# Patient Record
Sex: Male | Born: 1950 | Race: White | Hispanic: No
Health system: Midwestern US, Academic
[De-identification: ages and names within clinical notes are randomized; demographics above are authoritative.]

---

## 2016-04-28 MED ORDER — FLECAINIDE 100 MG PO TAB
100 mg | ORAL_TABLET | Freq: Three times a day (TID) | ORAL | 3 refills | 30.00000 days | Status: DC
Start: 2016-04-28 — End: 2017-01-02

## 2016-08-27 ENCOUNTER — Encounter: Admit: 2016-08-27 | Discharge: 2016-08-27 | Payer: MEDICARE

## 2016-08-29 MED ORDER — LOSARTAN 100 MG PO TAB
ORAL_TABLET | Freq: Every day | ORAL | 11 refills | 30.00000 days | Status: AC
Start: 2016-08-29 — End: 2017-08-28

## 2016-09-03 ENCOUNTER — Encounter: Admit: 2016-09-03 | Discharge: 2016-09-03 | Payer: MEDICARE

## 2016-09-05 ENCOUNTER — Encounter: Admit: 2016-09-05 | Discharge: 2016-09-05 | Payer: MEDICARE

## 2016-09-05 MED ORDER — DIGOXIN 250 MCG PO TAB
ORAL_TABLET | Freq: Every day | ORAL | 11 refills | 30.00000 days | Status: AC
Start: 2016-09-05 — End: 2016-09-05

## 2016-09-05 MED ORDER — DIGOXIN 250 MCG PO TAB
125 ug | ORAL_TABLET | Freq: Every day | ORAL | 11 refills | 30.00000 days | Status: AC
Start: 2016-09-05 — End: 2017-12-07

## 2016-09-07 ENCOUNTER — Encounter: Admit: 2016-09-07 | Discharge: 2016-09-07 | Payer: MEDICARE

## 2016-09-07 MED ORDER — VERAPAMIL 120 MG PO TBER
ORAL_TABLET | Freq: Two times a day (BID) | ORAL | 11 refills | 90.00000 days | Status: AC
Start: 2016-09-07 — End: 2017-08-28

## 2016-09-28 ENCOUNTER — Encounter: Admit: 2016-09-28 | Discharge: 2016-09-28 | Payer: BC Managed Care – HMO

## 2016-09-28 ENCOUNTER — Encounter: Admit: 2016-09-28 | Discharge: 2016-09-28 | Payer: MEDICARE

## 2016-09-28 DIAGNOSIS — R972 Elevated prostate specific antigen [PSA]: Principal | ICD-10-CM

## 2016-09-28 DIAGNOSIS — I4891 Unspecified atrial fibrillation: Principal | ICD-10-CM

## 2016-09-28 DIAGNOSIS — N400 Enlarged prostate without lower urinary tract symptoms: ICD-10-CM

## 2016-09-28 DIAGNOSIS — Z720 Tobacco use: ICD-10-CM

## 2016-09-28 DIAGNOSIS — M1990 Unspecified osteoarthritis, unspecified site: ICD-10-CM

## 2016-09-28 DIAGNOSIS — M549 Dorsalgia, unspecified: ICD-10-CM

## 2016-09-28 DIAGNOSIS — I10 Essential (primary) hypertension: ICD-10-CM

## 2016-09-28 MED ORDER — CIPROFLOXACIN HCL 500 MG PO TAB
500 mg | ORAL_TABLET | Freq: Two times a day (BID) | ORAL | 0 refills | 10.00000 days | Status: AC
Start: 2016-09-28 — End: 2017-01-26

## 2016-09-28 NOTE — Telephone Encounter
Navigation Intake Assessment Document    Patient Name:  Zachary Manning  DOB:  09-Apr-1950  Insurance:   Zachary Manning  [id number]    Appointment Info:  [facility]: Dr. Alveta Heimlich, MD  09/28/16 arrive at 0930/ appointment at 10:00am    Diagnosis & Reason for Visit:  Elevated PSA    Physician Info:   Referring Physician:  Loura Back, MD   Contact Name & Number:  [facility]   PCP:    Location of Films:  No imaging    Location of Pathology:  no pathology    History of Present Illness:  Zachary Manning is a 66 y.o. male, referred by Zachary Back, MD, for prostate cancer screening.  His most recent PSA was 5.88 ng/mL on 08/23/16.  He has had prior PSA screening.     Prior history of elevated PSA: Yes .  Prior prostate biopsy: no.  History of enlarged prostate/BPH: Yes .  History of recent trauma or catheter placement (last 3 months): No.  UTI/ Pyelonephritis: No.  Prostatitis: No.  Family history of prostate cancer: none.  Family history of BRCA breast cancer or Lynch syndrome: No.    Lower urinary tract symptoms:     Hematuria: No.     Dysuria: No.     Frequency: Yes .     Urgency: Yes . Occasional     Nocturia: Yes .x1/night     Incomplete voids: No.    His past medical history is significant for BPH, hypertension, atrial fibrillation, Manning/spine pain, tobacco use (smokeless tobacco x 40 years)    PSA history (up to last 5 years):   11/26/15  PSA  5.7 ng/mL  Free PSA:  0.95   %Free PSA:  16.7% free (per note)  08/23/16  PSA  5.88 ng/mL    Comments:  Patient verbalized understanding of appointment date, time, and location.  Has my name and number if questions.  Scheduling letter to home address by [employer].

## 2016-09-28 NOTE — Progress Notes
Date of Service: 09/28/2016     Subjective:             History of Present Illness  Zachary Manning is a 66 y.o. male with  BPH, hypertension, atrial fibrillation (digoxin/flecainide, no blood thinners), back/spine pain, tobacco use (smokeless tobacco x 40 years who presents for referred by Benito Mccreedy, MD, for elevated PSA most recently to 5.88 ng/mL on 08/23/16. He has had prior PSA screening only once before 11/2015 which was stable at 5.7 ng/mL and his free PSA was 16.7% at that time. Patient has no family history of prostate cancer. He has minimal LUTS (slightly weak stream, nocturia x1). He denies any erectile dysfunction. He denies any prior PSAs or prostate biopsies. He has Afib but takes no blood thinners.  ???  Prior history of elevated PSA: Yes  Prior prostate biopsy: No  History of enlarged prostate/BPH: Yes  History of recent trauma or catheter placement (last 3 months): No.  UTI/ Pyelonephritis: No  Prostatitis: No.  Family history of prostate cancer: none.  Family history of BRCA breast cancer or Lynch syndrome: No.  ???  Lower urinary tract symptoms:  ?????? Hematuria: No.  ?????? Dysuria: No.  ?????? Frequency: Yes .  ?????? Urgency: Yes . Occasional  ?????? Nocturia: Yes .x1/night  ?????? Incomplete voids: No.  ???  His past medical history is significant for:  ???  PSA history (up to last 5 years):   11/26/15  PSA  5.7 ng/mL  Free PSA:  0.95   %Free PSA:  16.7% free (per note)  08/23/16  PSA  5.88 ng/mL  No results found for: PSA    Past Medical History:   Diagnosis Date   ??? Arthritis    ??? Atrial fibrillation (HCC)    ??? Back pain    ??? Hypertension 02/02/2011       Past Surgical History:   Procedure Laterality Date   ??? COLONOSCOPY     ??? ELECTROCARDIOGRAM         History reviewed. No pertinent family history.    Current Outpatient Prescriptions   Medication Sig Dispense Refill   ??? aspirin EC 81 mg tablet Take 81 mg by mouth daily.     ??? atorvastatin (LIPITOR) 20 mg tablet Take 1 tablet by mouth daily. 90 tablet 3 ??? COQ10 (UBIQUINOL) PO Take  by mouth.     ??? digoxin (LANOXIN) 250 mcg tablet Take one-half tablet by mouth daily. 15 tablet 11   ??? docosahexanoic acid/epa (FISH OIL PO) Take  by mouth.     ??? flecainide (TAMBOCOR) 100 mg tablet Take 1 tablet by mouth three times daily. 180 tablet 3   ??? losartan(+) (COZAAR) 100 mg tablet TAKE ONE TABLET BY MOUTH ONCE DAILY 30 tablet 11   ??? MAGNESIUM PO Take  by mouth.     ??? RED YEAST RICE PO Take  by mouth.     ??? verapamil CR (CALAN-SR) 120 mg tablet TAKE ONE TABLET BY MOUTH TWICE DAILY 60 tablet 11     No current facility-administered medications for this visit.        Allergies   Allergen Reactions   ??? Lisinopril COUGH   ??? Sulfa Dyne RASH       Social History     Social History   ??? Marital status: Divorced     Spouse name: N/A   ??? Number of children: N/A   ??? Years of education: N/A     Occupational History   ???  Not on file.     Social History Main Topics   ??? Smoking status: Former Smoker     Quit date: 2011   ??? Smokeless tobacco: Current User     Types: Chew   ??? Alcohol use No      Comment: Quit drinking 2 weeks ago.   ??? Drug use: No   ??? Sexual activity: Not on file     Other Topics Concern   ??? Not on file     Social History Narrative   ??? No narrative on file       Review of Systems   Constitutional: Negative for activity change, appetite change, chills, diaphoresis, fatigue, fever and unexpected weight change.   HENT: Negative for congestion, hearing loss, mouth sores and sinus pressure.    Eyes: Negative for visual disturbance.   Respiratory: Negative for apnea, cough, chest tightness and shortness of breath.    Cardiovascular: Negative for chest pain, palpitations and leg swelling.   Gastrointestinal: Negative for abdominal distention, abdominal pain, blood in stool, constipation, nausea and vomiting.   Genitourinary: Negative for decreased urine volume, difficulty urinating, discharge, dysuria, enuresis, flank pain, frequency, genital sores, hematuria, penile pain, penile swelling, scrotal swelling, testicular pain and urgency.   Musculoskeletal: Negative for arthralgias, back pain, gait problem and myalgias.   Skin: Negative for rash and wound.   Neurological: Negative for dizziness, tremors, seizures, syncope, weakness, light-headedness, numbness and headaches.   Hematological: Negative for adenopathy. Does not bruise/bleed easily.   Psychiatric/Behavioral: Negative for decreased concentration and dysphoric mood. The patient is not nervous/anxious.        Objective:         ??? aspirin EC 81 mg tablet Take 81 mg by mouth daily.   ??? atorvastatin (LIPITOR) 20 mg tablet Take 1 tablet by mouth daily.   ??? COQ10 (UBIQUINOL) PO Take  by mouth.   ??? digoxin (LANOXIN) 250 mcg tablet Take one-half tablet by mouth daily.   ??? docosahexanoic acid/epa (FISH OIL PO) Take  by mouth.   ??? flecainide (TAMBOCOR) 100 mg tablet Take 1 tablet by mouth three times daily.   ??? losartan(+) (COZAAR) 100 mg tablet TAKE ONE TABLET BY MOUTH ONCE DAILY   ??? MAGNESIUM PO Take  by mouth.   ??? RED YEAST RICE PO Take  by mouth.   ??? verapamil CR (CALAN-SR) 120 mg tablet TAKE ONE TABLET BY MOUTH TWICE DAILY     Vitals:    09/28/16 1017   BP: 144/75   Pulse: 49   Resp: 22   Temp: 36.5 ???C (97.7 ???F)   TempSrc: Oral   SpO2: 99%   Weight: 90.9 kg (200 lb 6.4 oz)   Height: 194 cm (76.38)     Body mass index is 24.15 kg/m???.     Physical Exam   Constitutional: He is oriented to person, place, and time. He appears well-developed and well-nourished. No distress.   HENT:   Head: Normocephalic and atraumatic.   Eyes: EOM are normal. Right eye exhibits no discharge. Left eye exhibits no discharge.   Neck: Normal range of motion. No JVD present.   Cardiovascular: Normal rate and regular rhythm.    Pulmonary/Chest: Effort normal and breath sounds normal. No stridor. No respiratory distress. He has no wheezes.   Abdominal: Soft. He exhibits no distension. There is no tenderness.   Genitourinary: Genitourinary Comments: DRE: 50 grams 8mm right firm nodule.    Musculoskeletal: Normal range of motion. He exhibits no  edema.   Neurological: He is alert and oriented to person, place, and time. Coordination normal.   Skin: Skin is warm. He is not diaphoretic. No erythema. No pallor.   Psychiatric: He has a normal mood and affect. His behavior is normal. Judgment and thought content normal.          Assessment and Plan:  Problem   Elevated Psa, Less Than 10 Ng/Ml      PSA history (up to last 5 years):   11/26/15  PSA  5.7 ng/mL  Free PSA:  0.95   %Free PSA:  16.7% free (per note)  08/23/16  PSA  5.88 ng/mL  No results found for: PSA    09/28/16 -- Initial visit with Dr. Jimmey Ralph. DRE right firm nodule 8mm. Plan for template biopsy.       Elevated PSA, less than 10 ng/ml  We had an extensive consultation reviewing the implications of elevated PSA, including both benign & malignant causes.  We discussed the possibility of an undiagnosed prostate cancer and reviewed risk stratification of prostate cancer and corresponding treatment recommendations in detail. I discussed his abnormal DRE with him with a firm right 8mm nodule. I counseled him that the only way to diagnose prostate cancer is to perform a prostate biopsy.    Based on the Prostate Cancer Prevention Trial Risk Calculator Version 2.0, based on race, age, PSA, family history, DRE, and prior biopsy history patient's risk of prostate cancer if biopsy were to be performed today was estimated to be:     11% chance of high-grade prostate cancer   19% chance of low-grade cancer   70% chance that the biopsy is negative for cancer    Additionally, discussed possible risks & complications of prostate biopsy including, but not limited to infection, hematuria, hematospermia, blood per rectum, urinary retention, chronic perineal pain, & erectile dysfunction. Reviewed with patient that 2-4% of men undergoing biopsy will have an infection that may require hospitalization. After extensive consultation & discussion he wishes to proceed with prostate biopsy.    He was prescribed prophylactic antibiotics.      Patient seen and discussed with Dr. Jimmey Ralph, who directed plan of care.    Lewis Shock, MD  Urology PGY-2       ATTESTATION    I personally performed the key portions of the E/M visit, discussed case with resident and concur with resident documentation of history, physical exam, assessment, and treatment plan unless otherwise noted.     Staff name:  Ross Marcus, MD Date:  09/28/2016

## 2016-10-04 ENCOUNTER — Encounter: Admit: 2016-10-04 | Discharge: 2016-10-04 | Payer: MEDICARE

## 2016-10-04 NOTE — Telephone Encounter
Returned patient's call. Patient called (has biopsy tomorrow) and has been doing his own research. He's convinced he should be doing 4K Score. I told him that Dr Marigene Ehlers clinic note did not mention it and that I thought you would have discussed with him, if you thought it was a reasonable option, since we do use it. I reiterated that the 4K is a tool, it is not diagnostic and he may well end up with a biopsy, delayed in timing. Reached out to Dr Jerline Pain to see if he can speak with patient.

## 2016-10-05 ENCOUNTER — Encounter: Admit: 2016-10-05 | Discharge: 2016-10-06

## 2016-10-05 ENCOUNTER — Encounter: Admit: 2016-10-05 | Discharge: 2016-10-05 | Payer: BC Managed Care – HMO

## 2016-10-05 ENCOUNTER — Encounter: Admit: 2016-10-05 | Discharge: 2016-10-05 | Payer: MEDICARE

## 2016-10-05 DIAGNOSIS — R972 Elevated prostate specific antigen [PSA]: Principal | ICD-10-CM

## 2016-10-05 DIAGNOSIS — I4891 Unspecified atrial fibrillation: Principal | ICD-10-CM

## 2016-10-05 DIAGNOSIS — M1990 Unspecified osteoarthritis, unspecified site: ICD-10-CM

## 2016-10-05 DIAGNOSIS — M549 Dorsalgia, unspecified: ICD-10-CM

## 2016-10-05 DIAGNOSIS — I10 Essential (primary) hypertension: ICD-10-CM

## 2016-10-05 MED ORDER — GENTAMICIN 40 MG/ML IJ SOLN
80 mg | Freq: Once | INTRAMUSCULAR | 0 refills | Status: CP
Start: 2016-10-05 — End: ?
  Administered 2016-10-05: 13:00:00 80 mg via INTRAMUSCULAR

## 2016-10-05 NOTE — Procedures
Procedure:  Transrectal ultrasound, ultrasound interpretation, periprostatic block, and 12 prostate biopsies.    Surgeon: Lear Ng, MD    Anesthesia:  1% Lidocaine Block    Complications:  None.    Blood Loss:  Negligible.    Indication for Procedure:  66 y.o. male w/ an elevated.  Completed enemas & started abx, as directed.    Procedure:  He placed in the left lateral position.  DRE performed.  47mm right lateral palpable prostate nodule.  7.5 MHz TRUS introduced into rectum w/o difficulty.  Prostate visualized & scanned from base to apex & lateral lobe to lateral lobe.  No hyper- & hypoechoic lesions.  Periprostatic 1% Lidocaine block performed bilaterally at the junction of the prostate base & seminal vesicles; 5 cc injected to each side.      TRUS Prostate Vol = 65.1 mL    (R)-side: 6 biopsy cores (right lateral mid is the nodule)  (L)-side: 6 biopsy cores    Digital pressure applied to prostate x 1-2 min following PNBx.     He tolerated the procedure well, and left the exam room in a pleasant disposition.  Verbal & written post-procedure instructions given to pt.    ASSESSMENT/ PLAN:    1.  Elevated PSA   - TRUS/ PNBx performed today, as detailed above.  - Complete abx, as directed.  - Patient will be called with results.  - RTC based on biopsy results.

## 2016-10-10 ENCOUNTER — Encounter: Admit: 2016-10-10 | Discharge: 2016-10-10 | Payer: MEDICARE

## 2016-10-10 NOTE — Telephone Encounter
I called Zachary Manning and relayed his biopsy results.  I recommended active surveillance.  We will plan on having return to discuss active surveillance.        Final Diagnosis:     A. Prostate," left medial apex", biopsy:   Benign prostatic glands and stroma.   Negative for high grade PIN or malignancy.      B. Prostate," left medial mid", biopsy:   Benign prostatic glands and stroma.   Negative for high grade PIN or malignancy.      C. Prostate," left medial base", biopsy:   Benign prostatic glands and stroma.   Negative for high grade PIN or malignancy.      D. Prostate," left lateral apex", biopsy:   Benign prostatic glands and stroma.   Negative for high grade PIN or malignancy.       E. Prostate," left lateral mid", biopsy:   Benign prostatic glands and stroma.   Negative for high grade PIN or malignancy.       F. Prostate," left lateral base", biopsy:   Benign prostatic glands and stroma.   Negative for high grade PIN or malignancy.       G. Prostate," right medial apex", biopsy:   Benign prostatic glands and stroma.   Negative for high grade PIN or malignancy.      H. 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 See comment     I. Prostate," right medial base", biopsy:   Benign prostatic glands and stroma.   Negative for high grade PIN or malignancy.       J. Prostate," right lateral apex", biopsy:   Benign prostatic glands and stroma.   Negative for high grade PIN or malignancy.      K. Prostate," right lateral mid", biopsy:   Benign prostatic glands and stroma.   Negative for high grade PIN or malignancy.       L. Prostate," right lateral base", biopsy:   Benign prostatic glands and stroma.   Negative for high grade PIN or malignancy.

## 2016-11-09 ENCOUNTER — Encounter: Admit: 2016-11-09 | Discharge: 2016-11-09 | Payer: MEDICARE

## 2016-11-09 ENCOUNTER — Encounter: Admit: 2016-11-09 | Discharge: 2016-11-09 | Payer: BC Managed Care – HMO

## 2016-11-09 DIAGNOSIS — C61 Malignant neoplasm of prostate: Principal | ICD-10-CM

## 2016-11-09 DIAGNOSIS — I4891 Unspecified atrial fibrillation: Principal | ICD-10-CM

## 2016-11-09 DIAGNOSIS — M1990 Unspecified osteoarthritis, unspecified site: ICD-10-CM

## 2016-11-09 DIAGNOSIS — I10 Essential (primary) hypertension: ICD-10-CM

## 2016-11-09 DIAGNOSIS — M549 Dorsalgia, unspecified: ICD-10-CM

## 2016-11-09 NOTE — Progress Notes
Name: Zachary Manning          MRN: [id number]      DOB: 06-20-1950      AGE: 66 y.o.   DATE OF SERVICE: 11/09/2016    Subjective:             Reason for Visit:  Heme/Onc Care    Zachary Manning is a 66 y.o. male.     Cancer Staging  No matching staging information was found for the patient.    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  His most recent PSA was 5.88 in July and he underwent systematic biopsy on 10/05/16 which revealed 1 core of 12 positive for Gleason grade group 1 prostate cancer, involving 10% of the core.  He denies lower urinary tract symptoms, specifically no straining, hesitancy, intermittency???only nocturia x1.  He has no problems obtaining erections he has no problems attaining erections and has no complaints today.       Review of Systems   Constitutional: Negative for activity change, appetite change, chills, diaphoresis, fatigue, fever and unexpected weight change.   HENT: Negative for congestion, hearing loss, mouth sores and sinus pressure.    Eyes: Negative for visual disturbance.   Respiratory: Negative for apnea, cough, chest tightness and shortness of breath.    Cardiovascular: Negative for chest pain, palpitations and leg swelling.   Gastrointestinal: Negative for abdominal pain, blood in stool, constipation, diarrhea, nausea, rectal pain and vomiting.   Genitourinary: Negative for decreased urine volume, difficulty urinating, discharge, dysuria, enuresis, flank pain, frequency, genital sores, hematuria, penile pain, penile swelling, scrotal swelling, testicular pain and urgency.   Musculoskeletal: Negative for arthralgias, back pain, gait problem and myalgias.   Skin: Negative for rash and wound.   Neurological: Negative for dizziness, tremors, seizures, syncope, weakness, light-headedness, numbness and headaches. Hematological: Negative for adenopathy. Does not bruise/bleed easily.   Psychiatric/Behavioral: Negative for confusion and decreased concentration. The patient is not nervous/anxious.          Objective:         ??? aspirin EC 81 mg tablet Take 81 mg by mouth daily.   ??? atorvastatin (LIPITOR) 20 mg tablet Take 1 tablet by mouth daily.   ??? ciprofloxacin (CIPRO) 500 mg tablet Take one tablet by mouth twice daily. Start taking the morning prior to procedure with Dr Jimmey Ralph. Take for 3 days.   ??? COQ10 (UBIQUINOL) PO Take  by mouth.   ??? digoxin (LANOXIN) 250 mcg tablet Take one-half tablet by mouth daily.   ??? docosahexanoic acid/epa (FISH OIL PO) Take  by mouth.   ??? flecainide (TAMBOCOR) 100 mg tablet Take 1 tablet by mouth three times daily.   ??? losartan(+) (COZAAR) 100 mg tablet TAKE ONE TABLET BY MOUTH ONCE DAILY   ??? MAGNESIUM PO Take  by mouth.   ??? RED YEAST RICE PO Take  by mouth.   ??? verapamil CR (CALAN-SR) 120 mg tablet TAKE ONE TABLET BY MOUTH TWICE DAILY     Vitals:    11/09/16 1437   BP: 129/61   Pulse: 63   Resp: 16   Temp: 36.6 ???C (97.8 ???F)   TempSrc: Oral   SpO2: 98%   Weight: 90.1 kg (198 lb 9.6 oz)   Height: 194 cm (76.38)     Body mass index is 23.94 kg/m???.     Pain Score:  Zero         Pain Addressed:  N/A    Patient Evaluated for a Clinical Trial: Patient not eligible for a treatment trial (including not needing treatment, needs palliative care, in remission). and No treatment clinical trial available for this patient.     [place] Cooperative Oncology Group performance status is 0, Fully active, able to carry on all pre-disease performance without restriction.Marland Kitchen     Physical Exam   Constitutional: He is oriented to person, place, and time. He appears well-developed and well-nourished. No distress.   HENT:   Head: Normocephalic and atraumatic.   Eyes: Pupils are equal, round, and reactive to light. EOM are normal. Right eye exhibits no discharge. Left eye exhibits no discharge. Neck: No tracheal deviation present.   Cardiovascular: Normal rate and regular rhythm.    Pulmonary/Chest: Effort normal. No respiratory distress.   Abdominal: He exhibits no distension.   Neurological: He is alert and oriented to person, place, and time.   Skin: He is not diaphoretic.   Psychiatric: He has a normal mood and affect. His behavior is normal. Judgment and thought content normal.             Assessment and Plan:  Problem   Prostate Cancer (Hcc)      PSA history (up to last 5 years):   11/26/15  PSA  5.7 ng/mL  Free PSA:  0.95   %Free PSA:  16.7% free (per note)  08/23/16  PSA  5.88 ng/mL  No results found for: PSA    09/28/16 -- Initial visit with Dr. Jimmey Ralph. DRE right firm nodule 8mm. Plan for template biopsy.    10/05/16  Final Diagnosis:     A. Prostate, left medial apex, biopsy:   Benign prostatic glands and stroma.   Negative for high grade PIN or malignancy. ???     B. Prostate, left medial mid, biopsy:   Benign prostatic glands and stroma.   Negative for high grade PIN or malignancy. ???     C. Prostate, left medial base, biopsy:   Benign prostatic glands and stroma.   Negative for high grade PIN or malignancy. ???     D. Prostate, left lateral apex, biopsy:   Benign prostatic glands and stroma.   Negative for high grade PIN or malignancy. ??? ???     E. Prostate, left lateral mid, biopsy:   Benign prostatic glands and stroma.   Negative for high grade PIN or malignancy. ??? ???     F. Prostate, left lateral base, biopsy:   Benign prostatic glands and stroma.   Negative for high grade PIN or malignancy. ??? ???     G. Prostate, right medial apex, biopsy:   Benign prostatic glands and stroma.   Negative for high grade PIN or malignancy. ???     H. 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   10% of the needle core tissue and measuring 2 mm in length. See comment     I. Prostate, right medial base, biopsy: Benign prostatic glands and stroma.   Negative for high grade PIN or malignancy. ??? ???     J. Prostate, right lateral apex, biopsy:   Benign prostatic glands and stroma.   Negative for high grade PIN or malignancy. ???     K. Prostate, right lateral mid, biopsy:   Benign prostatic glands and stroma.  Negative for high grade PIN or malignancy. ??? ???     L. Prostate, right lateral base, biopsy:   Benign prostatic glands and stroma.   Negative for high grade PIN or malignancy. ???          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  We had an in-depth discussion regarding AUA guidelines for very low risk prostate cancer and active surveillance to be the appropriate recommendation at this time.  We discussed the pathophysiology of prostate cancer and treatment options if he were to progress to a high risk category on active surveillance.  He is amenable to active surveillance and stated that if he was to upgrade risk categories in the future he would elect for surgery, and does not desire radiation in the future. We discussed that our next recommended step would be prostate MRI in 6 months. If positive, then we will proceed with fusion biopsy, however if negative we will plan for follow-up with PSA and then repeat systematic biopsy at 18 months post-diagnosis.  -- RTC in 6 months with prostate MRI prior    Patient seen and discussed with staff surgeon, Dr. Jimmey Ralph, who directed plan of care    Janell Quiet, M.D.  Urology PGY-1    ATTESTATION    I personally performed the key portions of the E/M visit, discussed case with resident and concur with resident documentation of history, physical exam, assessment, and treatment plan unless otherwise noted.    Staff name:  Ross Marcus, MD Date:  11/09/2016

## 2016-11-09 NOTE — Assessment & Plan Note
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  We had an in-depth discussion regarding AUA guidelines for very low risk prostate cancer and active surveillance to be the appropriate recommendation at this time.  We discussed the pathophysiology of prostate cancer and treatment options if he were to progress to a high risk category on active surveillance.  He is amenable to active surveillance and stated that if he was to upgrade risk categories in the future he would elect for surgery, and does not desire radiation in the future. We discussed that our next recommended step would be prostate MRI in 6 months. If positive, then we will proceed with fusion biopsy, however if negative we will plan for follow-up with PSA and then repeat systematic biopsy at 18 months post-diagnosis.  -- RTC in 6 months with prostate MRI prior

## 2017-01-01 ENCOUNTER — Encounter: Admit: 2017-01-01 | Discharge: 2017-01-01 | Payer: MEDICARE

## 2017-01-01 DIAGNOSIS — R002 Palpitations: ICD-10-CM

## 2017-01-01 DIAGNOSIS — I4892 Unspecified atrial flutter: ICD-10-CM

## 2017-01-01 DIAGNOSIS — I480 Paroxysmal atrial fibrillation: Principal | ICD-10-CM

## 2017-01-02 MED ORDER — FLECAINIDE 100 MG PO TAB
100 mg | ORAL_TABLET | Freq: Three times a day (TID) | ORAL | 3 refills | 30.00000 days | Status: AC
Start: 2017-01-02 — End: 2017-08-28

## 2017-01-11 ENCOUNTER — Encounter: Admit: 2017-01-11 | Discharge: 2017-01-11 | Payer: MEDICARE

## 2017-01-11 DIAGNOSIS — I480 Paroxysmal atrial fibrillation: Principal | ICD-10-CM

## 2017-01-11 DIAGNOSIS — I10 Essential (primary) hypertension: ICD-10-CM

## 2017-01-11 NOTE — Progress Notes
Lab order mailed to patient.

## 2017-01-23 ENCOUNTER — Encounter: Admit: 2017-01-23 | Discharge: 2017-01-23 | Payer: MEDICARE

## 2017-01-23 DIAGNOSIS — I480 Paroxysmal atrial fibrillation: Principal | ICD-10-CM

## 2017-01-23 DIAGNOSIS — I10 Essential (primary) hypertension: ICD-10-CM

## 2017-01-23 LAB — LIPID PROFILE
Lab: 49
Lab: 127 — ABNORMAL HIGH (ref <100)
Lab: 13
Lab: 190
Lab: 65

## 2017-01-23 LAB — COMPREHENSIVE METABOLIC PANEL: Lab: 140

## 2017-01-26 ENCOUNTER — Encounter: Admit: 2017-01-26 | Discharge: 2017-01-26 | Payer: MEDICARE

## 2017-01-26 ENCOUNTER — Ambulatory Visit: Admit: 2017-01-26 | Discharge: 2017-01-27 | Payer: BC Managed Care – HMO

## 2017-01-26 DIAGNOSIS — I10 Essential (primary) hypertension: ICD-10-CM

## 2017-01-26 DIAGNOSIS — M549 Dorsalgia, unspecified: ICD-10-CM

## 2017-01-26 DIAGNOSIS — M1990 Unspecified osteoarthritis, unspecified site: ICD-10-CM

## 2017-01-26 DIAGNOSIS — C61 Malignant neoplasm of prostate: ICD-10-CM

## 2017-01-26 DIAGNOSIS — I4892 Unspecified atrial flutter: ICD-10-CM

## 2017-01-26 DIAGNOSIS — R001 Bradycardia, unspecified: ICD-10-CM

## 2017-01-26 DIAGNOSIS — I480 Paroxysmal atrial fibrillation: Principal | ICD-10-CM

## 2017-01-26 DIAGNOSIS — K089 Disorder of teeth and supporting structures, unspecified: ICD-10-CM

## 2017-01-26 DIAGNOSIS — I4891 Unspecified atrial fibrillation: Principal | ICD-10-CM

## 2017-01-26 DIAGNOSIS — R002 Palpitations: ICD-10-CM

## 2017-02-19 ENCOUNTER — Encounter: Admit: 2017-02-19 | Discharge: 2017-02-19 | Payer: MEDICARE

## 2017-02-21 MED ORDER — ATORVASTATIN 20 MG PO TAB
ORAL_TABLET | Freq: Every day | 11 refills | Status: AC
Start: 2017-02-21 — End: 2018-03-12

## 2017-04-15 ENCOUNTER — Ambulatory Visit: Admit: 2017-04-15 | Discharge: 2017-04-16

## 2017-04-15 ENCOUNTER — Encounter: Admit: 2017-04-15 | Discharge: 2017-04-15 | Payer: MEDICARE

## 2017-04-15 ENCOUNTER — Ambulatory Visit: Admit: 2017-04-15 | Discharge: 2017-04-15 | Payer: BC Managed Care – HMO

## 2017-04-15 DIAGNOSIS — C61 Malignant neoplasm of prostate: Principal | ICD-10-CM

## 2017-04-15 DIAGNOSIS — Z1389 Encounter for screening for other disorder: Principal | ICD-10-CM

## 2017-04-15 MED ORDER — GADOBENATE DIMEGLUMINE 529 MG/ML (0.1MMOL/0.2ML) IV SOLN
18 mL | Freq: Once | INTRAVENOUS | 0 refills | Status: CP
Start: 2017-04-15 — End: ?
  Administered 2017-04-15: 21:00:00 18 mL via INTRAVENOUS

## 2017-04-15 MED ORDER — SODIUM CHLORIDE 0.9 % IJ SOLN
50 mL | Freq: Once | INTRAVENOUS | 0 refills | Status: CP
Start: 2017-04-15 — End: ?
  Administered 2017-04-15: 21:00:00 50 mL via INTRAVENOUS

## 2017-04-17 ENCOUNTER — Encounter: Admit: 2017-04-17 | Discharge: 2017-04-17 | Payer: MEDICARE

## 2017-04-17 MED ORDER — CIPROFLOXACIN HCL 500 MG PO TAB
500 mg | ORAL_TABLET | Freq: Two times a day (BID) | ORAL | 0 refills | 10.00000 days | Status: AC
Start: 2017-04-17 — End: ?

## 2017-05-03 ENCOUNTER — Encounter: Admit: 2017-05-03 | Discharge: 2017-05-03 | Payer: MEDICARE

## 2017-05-09 ENCOUNTER — Encounter: Admit: 2017-05-09 | Discharge: 2017-05-09 | Payer: MEDICARE

## 2017-05-10 ENCOUNTER — Encounter: Admit: 2017-05-10 | Discharge: 2017-05-10 | Payer: MEDICARE

## 2017-05-11 ENCOUNTER — Encounter: Admit: 2017-05-11 | Discharge: 2017-05-11 | Payer: BC Managed Care – HMO

## 2017-05-11 ENCOUNTER — Encounter: Admit: 2017-05-11 | Discharge: 2017-05-11 | Payer: MEDICARE

## 2017-05-11 DIAGNOSIS — C61 Malignant neoplasm of prostate: Principal | ICD-10-CM

## 2017-05-11 DIAGNOSIS — I10 Essential (primary) hypertension: ICD-10-CM

## 2017-05-11 DIAGNOSIS — M1990 Unspecified osteoarthritis, unspecified site: ICD-10-CM

## 2017-05-11 DIAGNOSIS — M549 Dorsalgia, unspecified: ICD-10-CM

## 2017-05-11 DIAGNOSIS — I4891 Unspecified atrial fibrillation: Principal | ICD-10-CM

## 2017-05-11 MED ORDER — LIDOCAINE HCL 10 MG/ML (1 %) IJ SOLN
10 mL | Freq: Once | INTRAMUSCULAR | 0 refills | Status: CP
Start: 2017-05-11 — End: ?
  Administered 2017-05-11: 14:00:00 10 mL via INTRAMUSCULAR

## 2017-05-11 MED ORDER — GENTAMICIN 40 MG/ML IJ SOLN
80 mg | Freq: Once | INTRAMUSCULAR | 0 refills | Status: CP
Start: 2017-05-11 — End: ?
  Administered 2017-05-11: 14:00:00 80 mg via INTRAMUSCULAR

## 2017-05-15 ENCOUNTER — Encounter: Admit: 2017-05-15 | Discharge: 2017-05-15 | Payer: MEDICARE

## 2017-05-15 ENCOUNTER — Ambulatory Visit: Admit: 2017-05-15 | Discharge: 2017-05-23 | Payer: BC Managed Care – HMO

## 2017-05-15 DIAGNOSIS — C61 Malignant neoplasm of prostate: Principal | ICD-10-CM

## 2017-05-18 ENCOUNTER — Encounter: Admit: 2017-05-18 | Discharge: 2017-05-18 | Payer: MEDICARE

## 2017-05-24 ENCOUNTER — Ambulatory Visit: Admit: 2017-05-24 | Discharge: 2017-06-07 | Payer: BC Managed Care – HMO

## 2017-06-07 ENCOUNTER — Ambulatory Visit: Admit: 2017-06-02 | Discharge: 2017-06-02 | Payer: MEDICARE

## 2017-06-07 DIAGNOSIS — C61 Malignant neoplasm of prostate: Principal | ICD-10-CM

## 2017-06-08 ENCOUNTER — Encounter: Admit: 2017-06-08 | Discharge: 2017-06-08 | Payer: MEDICARE

## 2017-06-08 ENCOUNTER — Encounter: Admit: 2017-06-08 | Discharge: 2017-06-08 | Payer: BC Managed Care – HMO

## 2017-06-08 DIAGNOSIS — M549 Dorsalgia, unspecified: ICD-10-CM

## 2017-06-08 DIAGNOSIS — C61 Malignant neoplasm of prostate: Principal | ICD-10-CM

## 2017-06-08 DIAGNOSIS — M1990 Unspecified osteoarthritis, unspecified site: ICD-10-CM

## 2017-06-08 DIAGNOSIS — I10 Essential (primary) hypertension: ICD-10-CM

## 2017-06-08 DIAGNOSIS — I4891 Unspecified atrial fibrillation: Principal | ICD-10-CM

## 2017-06-08 MED ORDER — LEUPROLIDE (4 MONTH) 30 MG IM SYKT
30 mg | Freq: Once | INTRAMUSCULAR | 0 refills | Status: CN
Start: 2017-06-08 — End: ?

## 2017-06-16 ENCOUNTER — Encounter: Admit: 2017-06-16 | Discharge: 2017-06-16 | Payer: BC Managed Care – HMO

## 2017-06-16 DIAGNOSIS — Z79818 Long term (current) use of other agents affecting estrogen receptors and estrogen levels: ICD-10-CM

## 2017-06-16 DIAGNOSIS — C61 Malignant neoplasm of prostate: Principal | ICD-10-CM

## 2017-06-16 MED ORDER — LEUPROLIDE (4 MONTH) 30 MG IM SYKT
30 mg | Freq: Once | INTRAMUSCULAR | 0 refills | Status: CP
Start: 2017-06-16 — End: ?
  Administered 2017-06-16: 18:00:00 30 mg via INTRAMUSCULAR

## 2017-07-13 ENCOUNTER — Encounter: Admit: 2017-07-13 | Discharge: 2017-07-13 | Payer: MEDICARE

## 2017-07-13 DIAGNOSIS — C61 Malignant neoplasm of prostate: Principal | ICD-10-CM

## 2017-07-14 ENCOUNTER — Encounter: Admit: 2017-07-14 | Discharge: 2017-07-14 | Payer: MEDICARE

## 2017-07-14 ENCOUNTER — Ambulatory Visit: Admit: 2017-07-14 | Discharge: 2017-07-14 | Payer: MEDICARE

## 2017-07-14 DIAGNOSIS — C61 Malignant neoplasm of prostate: Principal | ICD-10-CM

## 2017-07-14 DIAGNOSIS — M1990 Unspecified osteoarthritis, unspecified site: ICD-10-CM

## 2017-07-14 DIAGNOSIS — M549 Dorsalgia, unspecified: ICD-10-CM

## 2017-07-14 DIAGNOSIS — I10 Essential (primary) hypertension: ICD-10-CM

## 2017-07-14 DIAGNOSIS — I4891 Unspecified atrial fibrillation: Principal | ICD-10-CM

## 2017-07-14 LAB — URINALYSIS DIPSTICK REFLEX TO CULTURE
Lab: 1 (ref 1.003–1.035)
Lab: 5 (ref 5.0–8.0)
Lab: NEGATIVE
Lab: NEGATIVE
Lab: NEGATIVE
Lab: NEGATIVE
Lab: NEGATIVE
Lab: NEGATIVE
Lab: NEGATIVE
Lab: POSITIVE — AB

## 2017-07-14 LAB — PROSTATIC SPECIFIC ANTIGEN-PSA: Lab: 4.8 ng/mL — ABNORMAL HIGH (ref <4.01)

## 2017-07-14 LAB — URINALYSIS MICROSCOPIC REFLEX TO CULTURE

## 2017-07-17 ENCOUNTER — Encounter: Admit: 2017-07-17 | Discharge: 2017-07-17 | Payer: MEDICARE

## 2017-07-17 DIAGNOSIS — C61 Malignant neoplasm of prostate: Principal | ICD-10-CM

## 2017-07-17 MED ORDER — CIPROFLOXACIN HCL 500 MG PO TAB
500 mg | ORAL_TABLET | Freq: Two times a day (BID) | ORAL | 0 refills | 10.00000 days | Status: AC
Start: 2017-07-17 — End: 2017-12-07

## 2017-07-23 ENCOUNTER — Ambulatory Visit: Admit: 2017-07-09 | Discharge: 2017-07-23 | Payer: MEDICAID

## 2017-07-23 DIAGNOSIS — C61 Malignant neoplasm of prostate: Principal | ICD-10-CM

## 2017-07-24 ENCOUNTER — Ambulatory Visit: Admit: 2017-07-24 | Discharge: 2017-08-07 | Payer: MEDICAID

## 2017-07-24 DIAGNOSIS — C61 Malignant neoplasm of prostate: Principal | ICD-10-CM

## 2017-08-02 ENCOUNTER — Ambulatory Visit: Admit: 2017-08-02 | Discharge: 2017-08-02 | Payer: MEDICARE

## 2017-08-02 MED ORDER — LORAZEPAM 1 MG PO TAB
1 mg | Freq: Once | ORAL | 0 refills | Status: CP
Start: 2017-08-02 — End: ?
  Administered 2017-08-02: 15:00:00 1 mg via ORAL

## 2017-08-02 MED ORDER — LIDOCAINE-PRILOCAINE 2.5-2.5 % TP CREA
Freq: Once | TOPICAL | 0 refills | Status: CP
Start: 2017-08-02 — End: ?
  Administered 2017-08-02: 15:00:00 via TOPICAL

## 2017-08-02 MED ORDER — OXYCODONE 5 MG PO TAB
5 mg | Freq: Once | ORAL | 0 refills | Status: CP
Start: 2017-08-02 — End: ?
  Administered 2017-08-02: 15:00:00 5 mg via ORAL

## 2017-08-07 DIAGNOSIS — C61 Malignant neoplasm of prostate: Principal | ICD-10-CM

## 2017-08-08 ENCOUNTER — Ambulatory Visit: Admit: 2017-08-08 | Discharge: 2017-08-23 | Payer: MEDICAID

## 2017-08-09 ENCOUNTER — Ambulatory Visit: Admit: 2017-08-09 | Discharge: 2017-08-10

## 2017-08-09 ENCOUNTER — Encounter: Admit: 2017-08-09 | Discharge: 2017-08-09 | Payer: MEDICARE

## 2017-08-21 ENCOUNTER — Encounter: Admit: 2017-08-21 | Discharge: 2017-08-21 | Payer: MEDICARE

## 2017-08-21 ENCOUNTER — Ambulatory Visit: Admit: 2017-08-21 | Discharge: 2017-08-21 | Payer: MEDICARE

## 2017-08-21 DIAGNOSIS — I4891 Unspecified atrial fibrillation: Principal | ICD-10-CM

## 2017-08-21 DIAGNOSIS — M1990 Unspecified osteoarthritis, unspecified site: ICD-10-CM

## 2017-08-21 DIAGNOSIS — I10 Essential (primary) hypertension: ICD-10-CM

## 2017-08-21 DIAGNOSIS — M549 Dorsalgia, unspecified: ICD-10-CM

## 2017-08-23 ENCOUNTER — Encounter: Admit: 2017-08-23 | Discharge: 2017-08-23 | Payer: MEDICARE

## 2017-08-23 DIAGNOSIS — C61 Malignant neoplasm of prostate: Principal | ICD-10-CM

## 2017-08-23 MED ORDER — TAMSULOSIN 0.4 MG PO CAP
ORAL_CAPSULE | Freq: Every day | ORAL | 3 refills | 90.00000 days | Status: DC
Start: 2017-08-23 — End: 2017-10-23

## 2017-08-23 MED ORDER — TAMSULOSIN 0.4 MG PO CAP
.4 mg | ORAL_CAPSULE | Freq: Every day | ORAL | 3 refills | 90.00000 days | Status: AC
Start: 2017-08-23 — End: 2017-08-23
  Filled 2017-08-23 (×2): qty 90, 90d supply, fill #1

## 2017-08-24 ENCOUNTER — Ambulatory Visit: Admit: 2017-08-24 | Discharge: 2017-09-07 | Payer: MEDICAID

## 2017-08-25 ENCOUNTER — Encounter: Admit: 2017-08-25 | Discharge: 2017-08-26

## 2017-08-25 ENCOUNTER — Encounter: Admit: 2017-08-25 | Discharge: 2017-08-25 | Payer: MEDICARE

## 2017-08-25 DIAGNOSIS — R300 Dysuria: Principal | ICD-10-CM

## 2017-08-25 LAB — URINALYSIS DIPSTICK REFLEX TO CULTURE
Lab: 1 — ABNORMAL LOW (ref 1.003–1.035)
Lab: NEGATIVE
Lab: NEGATIVE
Lab: NEGATIVE
Lab: NEGATIVE
Lab: 7 (ref 5.0–8.0)
Lab: NEGATIVE
Lab: NEGATIVE

## 2017-08-25 LAB — URINALYSIS MICROSCOPIC REFLEX TO CULTURE

## 2017-08-25 MED ORDER — OXYBUTYNIN CHLORIDE 5 MG PO TR24
5 mg | ORAL_TABLET | Freq: Every day | ORAL | 3 refills | 12.00000 days | Status: AC
Start: 2017-08-25 — End: 2017-08-25

## 2017-08-25 MED ORDER — OXYBUTYNIN CHLORIDE 5 MG PO TR24
5 mg | ORAL_TABLET | Freq: Every day | ORAL | 3 refills | 12.00000 days | Status: AC
Start: 2017-08-25 — End: 2017-12-07

## 2017-08-26 ENCOUNTER — Encounter: Admit: 2017-08-26 | Discharge: 2017-08-26 | Payer: MEDICARE

## 2017-08-26 DIAGNOSIS — R002 Palpitations: ICD-10-CM

## 2017-08-26 DIAGNOSIS — I4892 Unspecified atrial flutter: ICD-10-CM

## 2017-08-26 DIAGNOSIS — I480 Paroxysmal atrial fibrillation: Principal | ICD-10-CM

## 2017-08-27 ENCOUNTER — Encounter: Admit: 2017-08-27 | Discharge: 2017-08-27 | Payer: MEDICARE

## 2017-08-28 ENCOUNTER — Encounter: Admit: 2017-08-28 | Discharge: 2017-08-28 | Payer: MEDICARE

## 2017-08-28 MED ORDER — LOSARTAN 100 MG PO TAB
ORAL_TABLET | Freq: Every day | ORAL | 5 refills | 30.00000 days | Status: AC
Start: 2017-08-28 — End: 2017-10-19

## 2017-08-28 MED ORDER — FLECAINIDE 100 MG PO TAB
100 mg | ORAL_TABLET | Freq: Three times a day (TID) | ORAL | 5 refills | 30.00000 days | Status: AC
Start: 2017-08-28 — End: 2018-08-22

## 2017-08-28 MED ORDER — DIGOXIN 250 MCG PO TAB
ORAL_TABLET | Freq: Every day | ORAL | 5 refills | 30.00000 days | Status: AC
Start: 2017-08-28 — End: 2018-03-26

## 2017-08-28 MED ORDER — VERAPAMIL 120 MG PO TBER
ORAL_TABLET | Freq: Two times a day (BID) | ORAL | 11 refills | 90.00000 days | Status: AC
Start: 2017-08-28 — End: 2018-08-22

## 2017-08-29 ENCOUNTER — Ambulatory Visit: Admit: 2017-08-29 | Discharge: 2017-08-29 | Payer: MEDICARE

## 2017-08-29 ENCOUNTER — Encounter: Admit: 2017-08-29 | Discharge: 2017-08-29 | Payer: MEDICARE

## 2017-08-29 DIAGNOSIS — R300 Dysuria: Secondary | ICD-10-CM

## 2017-08-31 ENCOUNTER — Encounter: Admit: 2017-08-31 | Discharge: 2017-08-31 | Payer: MEDICARE

## 2017-09-07 DIAGNOSIS — C61 Malignant neoplasm of prostate: Principal | ICD-10-CM

## 2017-09-07 DIAGNOSIS — R300 Dysuria: ICD-10-CM

## 2017-09-24 ENCOUNTER — Ambulatory Visit: Admit: 2017-09-24 | Discharge: 2017-10-08 | Payer: Medicaid Other

## 2017-10-02 ENCOUNTER — Encounter: Admit: 2017-10-02 | Discharge: 2017-10-02 | Payer: MEDICARE

## 2017-10-02 DIAGNOSIS — C61 Malignant neoplasm of prostate: Principal | ICD-10-CM

## 2017-10-05 ENCOUNTER — Ambulatory Visit: Admit: 2017-10-05 | Discharge: 2017-10-05 | Payer: MEDICARE

## 2017-10-05 LAB — PROSTATIC SPECIFIC ANTIGEN-PSA: Lab: 0.2 ng/mL (ref <4.01)

## 2017-10-08 DIAGNOSIS — C61 Malignant neoplasm of prostate: Principal | ICD-10-CM

## 2017-10-09 ENCOUNTER — Encounter: Admit: 2017-10-09 | Discharge: 2017-10-09 | Payer: MEDICARE

## 2017-10-19 ENCOUNTER — Encounter: Admit: 2017-10-19 | Discharge: 2017-10-19 | Payer: MEDICARE

## 2017-10-19 MED ORDER — TELMISARTAN 80 MG PO TAB
80 mg | ORAL_TABLET | Freq: Every day | ORAL | 3 refills | 90.00000 days | Status: AC
Start: 2017-10-19 — End: 2017-12-07

## 2017-10-23 ENCOUNTER — Encounter: Admit: 2017-10-23 | Discharge: 2017-10-23 | Payer: MEDICARE

## 2017-10-23 MED ORDER — TAMSULOSIN 0.4 MG PO CAP
ORAL_CAPSULE | Freq: Every day | ORAL | 3 refills | 90.00000 days | Status: AC
Start: 2017-10-23 — End: 2018-07-22

## 2017-10-23 MED ORDER — CIPROFLOXACIN HCL 500 MG PO TAB
500 mg | ORAL_TABLET | Freq: Two times a day (BID) | ORAL | 0 refills | 10.00000 days | Status: AC
Start: 2017-10-23 — End: ?

## 2017-10-30 ENCOUNTER — Encounter: Admit: 2017-10-30 | Discharge: 2017-10-30 | Payer: MEDICARE

## 2017-10-30 ENCOUNTER — Encounter: Admit: 2017-10-30 | Discharge: 2017-10-30 | Payer: Medicaid Other

## 2017-10-30 DIAGNOSIS — M1990 Unspecified osteoarthritis, unspecified site: ICD-10-CM

## 2017-10-30 DIAGNOSIS — R300 Dysuria: ICD-10-CM

## 2017-10-30 DIAGNOSIS — C61 Malignant neoplasm of prostate: Principal | ICD-10-CM

## 2017-10-30 DIAGNOSIS — I4891 Unspecified atrial fibrillation: Principal | ICD-10-CM

## 2017-10-30 DIAGNOSIS — M549 Dorsalgia, unspecified: ICD-10-CM

## 2017-10-30 DIAGNOSIS — R319 Hematuria, unspecified: ICD-10-CM

## 2017-10-30 DIAGNOSIS — I10 Essential (primary) hypertension: ICD-10-CM

## 2017-10-30 LAB — URINALYSIS, MICROSCOPIC

## 2017-10-31 ENCOUNTER — Encounter: Admit: 2017-10-31 | Discharge: 2017-10-31 | Payer: MEDICARE

## 2017-11-01 ENCOUNTER — Encounter: Admit: 2017-11-01 | Discharge: 2017-11-01 | Payer: MEDICARE

## 2017-11-01 LAB — CULTURE-URINE W/SENSITIVITY

## 2017-11-15 ENCOUNTER — Encounter: Admit: 2017-11-15 | Discharge: 2017-11-15 | Payer: MEDICARE

## 2017-12-07 ENCOUNTER — Encounter: Admit: 2017-12-07 | Discharge: 2017-12-07 | Payer: MEDICARE

## 2017-12-07 ENCOUNTER — Ambulatory Visit: Admit: 2017-12-07 | Discharge: 2017-12-08 | Payer: Medicaid Other

## 2017-12-07 DIAGNOSIS — I10 Essential (primary) hypertension: ICD-10-CM

## 2017-12-07 DIAGNOSIS — F1010 Alcohol abuse, uncomplicated: ICD-10-CM

## 2017-12-07 DIAGNOSIS — R001 Bradycardia, unspecified: ICD-10-CM

## 2017-12-07 DIAGNOSIS — I480 Paroxysmal atrial fibrillation: Principal | ICD-10-CM

## 2017-12-07 DIAGNOSIS — K089 Disorder of teeth and supporting structures, unspecified: ICD-10-CM

## 2017-12-07 DIAGNOSIS — C61 Malignant neoplasm of prostate: ICD-10-CM

## 2017-12-07 DIAGNOSIS — M549 Dorsalgia, unspecified: ICD-10-CM

## 2017-12-07 DIAGNOSIS — I4891 Unspecified atrial fibrillation: Principal | ICD-10-CM

## 2017-12-07 DIAGNOSIS — R002 Palpitations: ICD-10-CM

## 2017-12-07 DIAGNOSIS — M1990 Unspecified osteoarthritis, unspecified site: ICD-10-CM

## 2017-12-24 ENCOUNTER — Ambulatory Visit: Admit: 2017-12-24 | Discharge: 2018-01-07 | Payer: MEDICARE

## 2018-01-03 ENCOUNTER — Encounter: Admit: 2018-01-03 | Discharge: 2018-01-03 | Payer: MEDICARE

## 2018-01-03 DIAGNOSIS — C61 Malignant neoplasm of prostate: Principal | ICD-10-CM

## 2018-01-05 ENCOUNTER — Encounter: Admit: 2018-01-05 | Discharge: 2018-01-05 | Payer: MEDICARE

## 2018-01-05 ENCOUNTER — Ambulatory Visit: Admit: 2018-01-05 | Discharge: 2018-01-05 | Payer: MEDICARE

## 2018-01-05 LAB — PROSTATIC SPECIFIC ANTIGEN-PSA: Lab: 0.6 ng/mL (ref <4.01)

## 2018-01-05 LAB — TESTOSTERONE,TOTAL: Lab: 430 ng/dL (ref 270–1070)

## 2018-01-07 DIAGNOSIS — C61 Malignant neoplasm of prostate: Principal | ICD-10-CM

## 2018-01-09 LAB — COMPREHENSIVE METABOLIC PANEL
Lab: 0.9
Lab: 1.2 — ABNORMAL HIGH (ref 0.0–1.0)
Lab: 107
Lab: 108 — ABNORMAL HIGH (ref 98–107)
Lab: 12
Lab: 141
Lab: 16
Lab: 17
Lab: 20
Lab: 25
Lab: 3.9
Lab: 4.2
Lab: 6.8
Lab: 83
Lab: 9.2

## 2018-01-09 LAB — LIVER FUNCTION PANEL
Lab: 0.5
Lab: 1.2 — ABNORMAL HIGH (ref 0.0–1.0)

## 2018-01-09 LAB — LIPID PROFILE
Lab: 120 — ABNORMAL LOW (ref 150–200)
Lab: 162
Lab: 38

## 2018-01-10 ENCOUNTER — Encounter: Admit: 2018-01-10 | Discharge: 2018-01-10 | Payer: MEDICARE

## 2018-01-10 DIAGNOSIS — I10 Essential (primary) hypertension: ICD-10-CM

## 2018-01-10 DIAGNOSIS — R002 Palpitations: ICD-10-CM

## 2018-01-10 DIAGNOSIS — I480 Paroxysmal atrial fibrillation: Principal | ICD-10-CM

## 2018-01-26 ENCOUNTER — Encounter: Admit: 2018-01-26 | Discharge: 2018-01-26 | Payer: MEDICARE

## 2018-02-13 ENCOUNTER — Encounter: Admit: 2018-02-13 | Discharge: 2018-02-13 | Payer: MEDICARE

## 2018-02-15 ENCOUNTER — Encounter: Admit: 2018-02-15 | Discharge: 2018-02-15 | Payer: MEDICARE

## 2018-02-15 MED ORDER — LOSARTAN 100 MG PO TAB
ORAL_TABLET | Freq: Every day | ORAL | 3 refills | 90.00000 days | Status: AC
Start: 2018-02-15 — End: 2018-09-13

## 2018-02-20 ENCOUNTER — Encounter: Admit: 2018-02-20 | Discharge: 2018-02-20 | Payer: MEDICARE

## 2018-03-09 ENCOUNTER — Ambulatory Visit: Admit: 2018-03-09 | Discharge: 2018-03-10 | Payer: MEDICARE

## 2018-03-09 ENCOUNTER — Encounter: Admit: 2018-03-09 | Discharge: 2018-03-09 | Payer: MEDICARE

## 2018-03-09 ENCOUNTER — Ambulatory Visit: Admit: 2018-03-09 | Discharge: 2018-03-09 | Payer: MEDICARE

## 2018-03-09 DIAGNOSIS — R002 Palpitations: ICD-10-CM

## 2018-03-09 DIAGNOSIS — R001 Bradycardia, unspecified: ICD-10-CM

## 2018-03-09 DIAGNOSIS — I480 Paroxysmal atrial fibrillation: Principal | ICD-10-CM

## 2018-03-09 DIAGNOSIS — K089 Disorder of teeth and supporting structures, unspecified: ICD-10-CM

## 2018-03-09 DIAGNOSIS — C61 Malignant neoplasm of prostate: ICD-10-CM

## 2018-03-09 DIAGNOSIS — I10 Essential (primary) hypertension: ICD-10-CM

## 2018-03-09 DIAGNOSIS — F1010 Alcohol abuse, uncomplicated: ICD-10-CM

## 2018-03-12 ENCOUNTER — Encounter: Admit: 2018-03-12 | Discharge: 2018-03-12 | Payer: MEDICARE

## 2018-03-12 MED ORDER — ATORVASTATIN 20 MG PO TAB
ORAL_TABLET | Freq: Every day | 3 refills | Status: AC
Start: 2018-03-12 — End: 2018-09-13

## 2018-03-19 ENCOUNTER — Encounter: Admit: 2018-03-19 | Discharge: 2018-03-19 | Payer: MEDICARE

## 2018-03-20 ENCOUNTER — Encounter: Admit: 2018-03-20 | Discharge: 2018-03-20 | Payer: MEDICARE

## 2018-03-20 NOTE — Telephone Encounter
-----   Message from Dorris Fetch, MD sent at 03/19/2018  4:37 PM CST -----  Please let this patient know that his stress testing was normal.  Thank you  ----- Message -----  From: Lucianne Muss, MD  Sent: 03/12/2018  10:51 AM CST  To: Dorris Fetch, MD

## 2018-03-20 NOTE — Telephone Encounter
Spoke with patient in response to voicemail received. He experienced isolated occurrence of ejaculation with scant blood. He has completed radiation, for his prostate cancer. Of note, he passed a kidney stone about a week ago. He was seen at [facility] for this. Reviewed with patient that ejaculate and kidney stone would have travelled out of the body through the same tract and likely, the blood is a byproduct of passing the stone. He denies pain/fever/chills or any urinary difficulty. Informed him that he should stay hydrated and monitor, for now. I will relay to Dr Jimmey Ralph and if we need to have patient do anything, at this time, will reach back out to him.

## 2018-03-20 NOTE — Telephone Encounter
Spoke with pt and he was given the information as below

## 2018-03-25 ENCOUNTER — Encounter: Admit: 2018-03-25 | Discharge: 2018-03-25 | Payer: MEDICARE

## 2018-03-26 MED ORDER — DIGOXIN 250 MCG (0.25 MG) PO TAB
ORAL_TABLET | Freq: Every day | ORAL | 11 refills | 30.00000 days | Status: AC
Start: 2018-03-26 — End: 2018-03-27

## 2018-03-27 ENCOUNTER — Encounter: Admit: 2018-03-27 | Discharge: 2018-03-27 | Payer: MEDICARE

## 2018-03-27 MED ORDER — DIGOXIN 125 MCG (0.125 MG) PO TAB
125 ug | ORAL_TABLET | Freq: Every day | ORAL | 3 refills | 30.00000 days | Status: AC
Start: 2018-03-27 — End: 2018-09-13

## 2018-04-25 ENCOUNTER — Encounter: Admit: 2018-04-25 | Discharge: 2018-04-25 | Payer: MEDICARE

## 2018-04-25 NOTE — Progress Notes
???   tamsulosin (FLOMAX) 0.4 mg capsule Take one capsule by mouth daily. Do not crush, chew or open capsules. Take 30 minutes following the same meal each day   ??? verapamil CR (CALAN-SR) 120 mg tablet TAKE 1 TABLET BY MOUTH TWICE DAILY     Vitals:    04/26/18 1421   BP: 136/65   BP Source: Arm, Left Upper   Patient Position: Sitting   Pulse: 64   Temp: 36.3 ???C (97.3 ???F)   TempSrc: Temporal   SpO2: 98%   Weight: 94.7 kg (208 lb 12.8 oz)   Height: 198.1 cm (78)   PainSc: Zero     Body mass index is 24.13 kg/m???.     Pain Score: Zero        Fatigue Scale: 0-None    KARNOFSKY PERFORMANCE SCORE:  90% Able to carry on normal activity; minor signs of disease     Physical Exam    General:  AAOx3, in no acute distress.  Head:  Normocephalic and atraumatic.  Eyes: Normal sclerae / conjunctivae.  Neck: trachea midline  Lungs: Breathing nonlabored, no wheezing  Abdomen: nondistended  Skin: no rashes or pallor  Psychiatric: Normal mood and affect      Lab Results   Component Value Date    PSA 0.74 04/26/2018    PSA 0.67 01/05/2018    PSA 0.20 10/05/2017    PSA 4.86 (H) 07/14/2017            Assessment and Plan:  Zachary Manning is a 68 y.o. 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 ???A treatment plan was designed to treat the prostate with SBRT to 7.25 Gy x 5 completed on 08/31/2017. Here today for 4 month interval follow up.    Plan:  - clinically stable with no new late radiation side effects  - Repeat PSA and testosterone pending, will call him with the results  - will follow up in clinic in 6 months    Zachary Carwin, MD, PhD  Radiation Oncology Resident, PGY-2  [facility]  Pager # [phone]     ---    Zachary Manning is doing well. He is having some rising PSA after SBRT for unfavorable intermediate risk prostate cancer.  This does not yet meet criteria for recurrence.  I recommend to continue to monitor.  I will have him follow up with me in 4 months.    Zachary Bouillon, MD

## 2018-04-26 ENCOUNTER — Encounter: Admit: 2018-04-26 | Discharge: 2018-04-26 | Payer: MEDICARE

## 2018-04-26 ENCOUNTER — Ambulatory Visit: Admit: 2018-04-25 | Discharge: 2018-05-09 | Payer: MEDICARE

## 2018-04-26 ENCOUNTER — Ambulatory Visit: Admit: 2018-04-26 | Discharge: 2018-04-26 | Payer: MEDICARE

## 2018-04-27 ENCOUNTER — Encounter: Admit: 2018-04-27 | Discharge: 2018-04-27 | Payer: MEDICARE

## 2018-04-27 NOTE — Telephone Encounter
Patient called to discuss his PSA result. He is second guessing his treatment decisions, but specifically inquiring about current plan and if there's need for additional scans. I spoke with him for about 20 minutes, mostly listening to his frustration about prostate cancer and how his course has gone. He's recently joined a Facebook support group and is relating his case to others, who have undetectable PSA levels. I encourage patient to remember that all cases are not the same. Offered visit with Dr Jimmey Ralph via telehealth, next week- he declined. Informed him that Dr Jimmey Ralph would support Dr Rondel Baton current plan for recheck in 4 months, but due to patient request, will schedule clinic visit in June. Patient reports that he will be more comfortable than waiting full 4 months. Encouraged him to call back with questions.

## 2018-05-09 DIAGNOSIS — C61 Malignant neoplasm of prostate: Principal | ICD-10-CM

## 2018-05-29 ENCOUNTER — Encounter: Admit: 2018-05-29 | Discharge: 2018-05-29 | Payer: MEDICARE

## 2018-06-12 ENCOUNTER — Encounter: Admit: 2018-06-12 | Discharge: 2018-06-12 | Payer: MEDICARE

## 2018-06-25 ENCOUNTER — Ambulatory Visit: Admit: 2018-06-25 | Discharge: 2018-07-09

## 2018-06-25 DIAGNOSIS — C61 Malignant neoplasm of prostate: Principal | ICD-10-CM

## 2018-06-27 ENCOUNTER — Ambulatory Visit: Admit: 2018-06-27 | Discharge: 2018-06-27

## 2018-06-27 ENCOUNTER — Encounter: Admit: 2018-06-27 | Discharge: 2018-06-27

## 2018-06-27 LAB — URINALYSIS MICROSCOPIC REFLEX TO CULTURE

## 2018-06-27 MED ORDER — CIPROFLOXACIN HCL 500 MG PO TAB
500 mg | ORAL_TABLET | Freq: Two times a day (BID) | ORAL | 0 refills | 10.00000 days | Status: DC
Start: 2018-06-27 — End: 2019-07-04

## 2018-06-27 NOTE — Progress Notes
Review of Systems   Genitourinary: Positive for difficulty urinating.   Musculoskeletal: Positive for back pain.   All other systems reviewed and are negative.

## 2018-06-27 NOTE — Progress Notes
Radiation Oncology Follow Up Note   Date: 06/27/2018       Zachary Manning is a 68 y.o. male.     There were no encounter diagnoses.  Staging: Cancer Staging  Prostate cancer [facility])  Staging form: Prostate, AJCC 8th Edition  - Clinical: Stage IIC (cT1c, cN0, cM0, PSA: 5.9, Grade Group: 3) - Unsigned      Site Treatment Dates Technique Energy Dose/???Fraction (cGy) Total Dose (cGy) Fractions Missed Treatment Days   Prostate 08/21/17 - 08/31/17 SBRT 10X [id number]   Concurrent systemic therapy:???ST ADT  ???    Subjective:          Cancer Staging  Prostate cancer [facility])  Staging form: Prostate, AJCC 8th Edition  - Clinical: Stage IIC (cT1c, cN0, cM0, PSA: 5.9, Grade Group: 3) - Unsigned      History of Present Illness    Zachary Manning returns today for follow-up.  He is concerned.  He developed gynecomastia, and was evaluated by his PCP, Dr. Alona Bene.  This was last week on Friday.  As part of work-up, sex hormones and PSA were checked.  The PSA was reported to be increased to 1.4.  He has not noted any other issues with pelvic pain.  He has not taken any supplements. He has noted increase volume of ejaculate, which has been blood tinged.         Review of Systems  Per nursing note    Objective:         ??? aspirin EC 81 mg tablet Take 81 mg by mouth daily.   ??? atorvastatin (LIPITOR) 20 mg tablet TAKE 1 TABLET BY MOUTH ONCE DAILY   ??? ciprofloxacin (CIPRO) 500 mg tablet Take one tablet by mouth twice daily.   ??? COQ10 (UBIQUINOL) PO Take  by mouth daily.   ??? digoxin (LANOXIN) 125 mcg (0.125 mg) tablet Take one tablet by mouth daily. Please make pt aware of change   ??? docosahexanoic acid/epa (FISH OIL PO) Take  by mouth daily.   ??? flecainide (TAMBOCOR) 100 mg tablet TAKE 1 TABLET BY MOUTH THREE TIMES DAILY   ??? losartan (COZAAR) 100 mg tablet TAKE 1 TABLET BY MOUTH ONCE DAILY   ??? MAGNESIUM PO Take  by mouth daily.   ??? tamsulosin (FLOMAX) 0.4 mg capsule Take one capsule by mouth daily. Do not crush, chew or open capsules. Take 30 minutes following the same meal each day   ??? verapamil CR (CALAN-SR) 120 mg tablet TAKE 1 TABLET BY MOUTH TWICE DAILY     Vitals:    06/27/18 0818 06/27/18 0819   BP: (!) 154/71    BP Source: Arm, Left Upper    Patient Position: Sitting    Pulse: 58    Resp: 16    Temp: 36.5 ???C (97.7 ???F)    TempSrc: Oral    SpO2: 98%    Weight: 92.8 kg (204 lb 8 oz)    PainSc: Zero Zero     Body mass index is 23.63 kg/m???.     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  He was treated with ST ADT and SBRT to the prostate.  PSA is up, now to 1.4.  This is quite a rise from just 2 months ago.  I think this may be a PSA bounce, or infection. Although this does not meet criteria for recurrence, the trend is concerning.  I will prescribe a course of antibiotics and re-check PSA in 3 weeks.    Harriette Bouillon, MD  Attending Physician  Department of Radiation Oncology, [facility]    ATTESTATION    I personally performed the E/M including history, physical exam, and MDM.    Staff name:  Harriette Bouillon, MD Date:  06/27/2018

## 2018-07-06 ENCOUNTER — Encounter: Admit: 2018-07-06 | Discharge: 2018-07-06

## 2018-07-09 ENCOUNTER — Encounter: Admit: 2018-07-09 | Discharge: 2018-07-09

## 2018-07-09 NOTE — Telephone Encounter
07/09/18 Spoke with pt and they stated he was seen by his PCP Melissa Huntington 2 weeks ago.   I have sent a request for those records STAT today edh

## 2018-07-10 ENCOUNTER — Encounter: Admit: 2018-07-10 | Discharge: 2018-07-10

## 2018-07-10 ENCOUNTER — Ambulatory Visit: Admit: 2018-07-18 | Discharge: 2018-07-18

## 2018-07-10 DIAGNOSIS — C61 Malignant neoplasm of prostate: Principal | ICD-10-CM

## 2018-07-10 NOTE — Telephone Encounter
On 07/10/18 Medical records received records from [place], [place]. They can be accessed through the [facility] for the 07/10/18 appt. Thank you.

## 2018-07-18 ENCOUNTER — Encounter: Admit: 2018-07-18 | Discharge: 2018-07-18

## 2018-07-18 DIAGNOSIS — M549 Dorsalgia, unspecified: Secondary | ICD-10-CM

## 2018-07-18 DIAGNOSIS — I4891 Unspecified atrial fibrillation: Secondary | ICD-10-CM

## 2018-07-18 DIAGNOSIS — C61 Malignant neoplasm of prostate: Principal | ICD-10-CM

## 2018-07-18 DIAGNOSIS — M1990 Unspecified osteoarthritis, unspecified site: Secondary | ICD-10-CM

## 2018-07-18 DIAGNOSIS — I10 Essential (primary) hypertension: Secondary | ICD-10-CM

## 2018-07-18 LAB — PROSTATIC SPECIFIC ANTIGEN-PSA: Lab: 1.9 ng/mL (ref <4.01)

## 2018-07-18 LAB — TESTOSTERONE,TOTAL: Lab: 405 ng/dL (ref 270–1070)

## 2018-07-18 NOTE — Progress Notes
Review of Systems   Genitourinary: Positive for frequency.   Musculoskeletal: Positive for back pain.   All other systems reviewed and are negative.

## 2018-07-18 NOTE — Progress Notes
Radiation Oncology Follow Up Note   Date: 07/18/2018       Zachary Manning is a 68 y.o. male.     The encounter diagnosis was Prostate cancer (HCC).  Staging: Cancer Staging  Prostate cancer [facility])  Staging form: Prostate, AJCC 8th Edition  - Clinical: Stage IIC (cT1c, cN0, cM0, PSA: 5.9, Grade Group: 3) - Unsigned        Subjective:          Cancer Staging  Prostate cancer (HCC)  Staging form: Prostate, AJCC 8th Edition  - Clinical: Stage IIC (cT1c, cN0, cM0, PSA: 5.9, Grade Group: 3) - Unsigned  ???  Site Treatment Dates Technique Energy Dose/???Fraction (cGy) Total Dose (cGy) Fractions Missed Treatment Days   Prostate 08/21/17 - 08/31/17 SBRT 10X [phone]   Concurrent systemic therapy:???ST ADT  ???      History of Present Illness    Zachary Manning returns today for follow-up.  He finished a course of antibiotics.  Still having high volume of watery ejaculate.  No other health changes.       Review of Systems  Per nursing note    Objective:         ??? aspirin EC 81 mg tablet Take 81 mg by mouth daily.   ??? atorvastatin (LIPITOR) 20 mg tablet TAKE 1 TABLET BY MOUTH ONCE DAILY   ??? ciprofloxacin (CIPRO) 500 mg tablet Take one tablet by mouth twice daily.   ??? COQ10 (UBIQUINOL) PO Take  by mouth daily.   ??? digoxin (LANOXIN) 125 mcg (0.125 mg) tablet Take one tablet by mouth daily. Please make pt aware of change   ??? docosahexanoic acid/epa (FISH OIL PO) Take  by mouth daily.   ??? flecainide (TAMBOCOR) 100 mg tablet TAKE 1 TABLET BY MOUTH THREE TIMES DAILY   ??? losartan (COZAAR) 100 mg tablet TAKE 1 TABLET BY MOUTH ONCE DAILY   ??? MAGNESIUM PO Take  by mouth daily.   ??? tamsulosin (FLOMAX) 0.4 mg capsule Take one capsule by mouth daily. Do not crush, chew or open capsules. Take 30 minutes following the same meal each day   ??? verapamil CR (CALAN-SR) 120 mg tablet TAKE 1 TABLET BY MOUTH TWICE DAILY     There were no vitals filed for this visit.  There is no height or weight on file to calculate BMI. 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  He was treated with ST ADT and SBRT to the prostate.  He has a had very rapid rise in the PSA over the past 2 months from 0.74, now up to 1.94 continuing to rise after a course of antibiotics.    I think the pace of the rise is not consistent with recurrent cancer.  However, the PSA is now almost 2, nearing the criteria for  biochemical recurrence.    I recommend restaging studies.  I will obtain a MRI and bone scan.  I will also refer him to Dr. Jimmey Ralph in urology for evaluation as well.    I will have him follow up with me after scans.    Harriette Bouillon, MD  Attending Physician  Department of Radiation Oncology, [facility]    ATTESTATION    I personally performed the E/M including history, physical exam, and MDM.    Staff name:  Harriette Bouillon, MD Date:  07/21/2018

## 2018-07-22 ENCOUNTER — Encounter: Admit: 2018-07-22 | Discharge: 2018-07-22

## 2018-07-22 MED ORDER — TAMSULOSIN 0.4 MG PO CAP
.4 mg | ORAL_CAPSULE | Freq: Every day | ORAL | 0 refills | 90.00000 days | Status: DC
Start: 2018-07-22 — End: 2018-09-13

## 2018-07-24 ENCOUNTER — Ambulatory Visit: Admit: 2018-07-10 | Discharge: 2018-07-24

## 2018-07-30 ENCOUNTER — Encounter: Admit: 2018-07-30 | Discharge: 2018-07-30

## 2018-07-30 DIAGNOSIS — C61 Malignant neoplasm of prostate: Secondary | ICD-10-CM

## 2018-07-30 MED ORDER — RP DX TC-99M MEDRONATE MCI
25 | Freq: Once | INTRAVENOUS | 0 refills | Status: CP
Start: 2018-07-30 — End: ?
  Administered 2018-07-30: 17:00:00 26.7 via INTRAVENOUS

## 2018-08-09 ENCOUNTER — Ambulatory Visit: Admit: 2018-08-22 | Discharge: 2018-08-22

## 2018-08-12 ENCOUNTER — Encounter: Admit: 2018-07-30 | Discharge: 2018-07-31

## 2018-08-12 ENCOUNTER — Encounter: Admit: 2018-07-30 | Discharge: 2018-08-12

## 2018-08-12 DIAGNOSIS — C61 Malignant neoplasm of prostate: Secondary | ICD-10-CM

## 2018-08-17 ENCOUNTER — Ambulatory Visit: Admit: 2018-08-17 | Discharge: 2018-08-17

## 2018-08-17 ENCOUNTER — Encounter: Admit: 2018-08-17 | Discharge: 2018-08-17

## 2018-08-17 DIAGNOSIS — C61 Malignant neoplasm of prostate: Principal | ICD-10-CM

## 2018-08-17 MED ORDER — SODIUM CHLORIDE 0.9 % IJ SOLN
50 mL | Freq: Once | INTRAVENOUS | 0 refills | Status: DC
Start: 2018-08-17 — End: 2018-08-22

## 2018-08-17 MED ORDER — GADOBENATE DIMEGLUMINE 529 MG/ML (0.1MMOL/0.2ML) IV SOLN
18 mL | Freq: Once | INTRAVENOUS | 0 refills | Status: CP
Start: 2018-08-17 — End: ?
  Administered 2018-08-17: 23:00:00 18 mL via INTRAVENOUS

## 2018-08-22 ENCOUNTER — Encounter: Admit: 2018-08-22 | Discharge: 2018-08-22

## 2018-08-22 DIAGNOSIS — M1990 Unspecified osteoarthritis, unspecified site: Secondary | ICD-10-CM

## 2018-08-22 DIAGNOSIS — I4892 Unspecified atrial flutter: Secondary | ICD-10-CM

## 2018-08-22 DIAGNOSIS — I10 Essential (primary) hypertension: Secondary | ICD-10-CM

## 2018-08-22 DIAGNOSIS — R002 Palpitations: Secondary | ICD-10-CM

## 2018-08-22 DIAGNOSIS — I4891 Unspecified atrial fibrillation: Secondary | ICD-10-CM

## 2018-08-22 DIAGNOSIS — I480 Paroxysmal atrial fibrillation: Secondary | ICD-10-CM

## 2018-08-22 DIAGNOSIS — M549 Dorsalgia, unspecified: Secondary | ICD-10-CM

## 2018-08-22 DIAGNOSIS — C61 Malignant neoplasm of prostate: Principal | ICD-10-CM

## 2018-08-22 MED ORDER — VERAPAMIL 120 MG PO TBER
ORAL_TABLET | Freq: Two times a day (BID) | ORAL | 3 refills | 90.00000 days | Status: DC
Start: 2018-08-22 — End: 2018-09-13

## 2018-08-22 MED ORDER — FLECAINIDE 100 MG PO TAB
100 mg | ORAL_TABLET | Freq: Three times a day (TID) | ORAL | 2 refills | 30.00000 days | Status: DC
Start: 2018-08-22 — End: 2018-09-13

## 2018-08-22 NOTE — Progress Notes
Review of Systems   Gastrointestinal: Positive for abdominal pain.   Musculoskeletal: Positive for back pain and neck pain.   All other systems reviewed and are negative.

## 2018-08-22 NOTE — Progress Notes
Radiation Oncology Follow Up Note   Date: 08/22/2018       Asbury Hair is a 68 y.o. male.     There were no encounter diagnoses.  Staging: Cancer Staging  Prostate cancer [facility])  Staging form: Prostate, AJCC 8th Edition  - Clinical: Stage IIC (cT1c, cN0, cM0, PSA: 5.9, Grade Group: 3) - Unsigned        Subjective:          Cancer Staging  Prostate cancer (HCC)  Staging form: Prostate, AJCC 8th Edition  - Clinical: Stage IIC (cT1c, cN0, cM0, PSA: 5.9, Grade Group: 3) - Unsigned      History of Present Illness  Site Treatment Dates Technique Energy Dose/???Fraction (cGy) Total Dose (cGy) Fractions Missed Treatment Days   Prostate 08/21/17 - 08/31/17 SBRT 10X [phone] 5 0   Concurrent systemic therapy:???ST ADT  ???  ???  Mr. Blenda Bridegroom returns for follow-up.  He returns following scans.  In brief, this is a 68 year old man with T1cN0M0 GS7 (4+3) PSA 5.9, group IIC prostate cancer.  He has the GS 4+3 cancer in a single core.  He was treated with a course of SBRT to the prostate with short term ADT.  After completion of therapy, he now has rising PSA up to almost 2.  He was recommended for scans.  He returns following scans.       Review of Systems  Per nursing note    Objective:         ??? aspirin EC 81 mg tablet Take 81 mg by mouth daily.   ??? atorvastatin (LIPITOR) 20 mg tablet TAKE 1 TABLET BY MOUTH ONCE DAILY   ??? ciprofloxacin (CIPRO) 500 mg tablet Take one tablet by mouth twice daily.   ??? COQ10 (UBIQUINOL) PO Take  by mouth daily.   ??? digoxin (LANOXIN) 125 mcg (0.125 mg) tablet Take one tablet by mouth daily. Please make pt aware of change   ??? docosahexanoic acid/epa (FISH OIL PO) Take  by mouth daily.   ??? flecainide (TAMBOCOR) 100 mg tablet TAKE 1 TABLET BY MOUTH THREE TIMES DAILY   ??? losartan (COZAAR) 100 mg tablet TAKE 1 TABLET BY MOUTH ONCE DAILY   ??? MAGNESIUM PO Take  by mouth daily.   ??? tamsulosin (FLOMAX) 0.4 mg capsule Take one capsule by mouth daily. Do not crush, chew or open capsules. Take 30 minutes following the same meal each day.   ??? verapamil CR (CALAN-SR) 120 mg tablet TAKE 1 TABLET BY MOUTH TWICE DAILY     There were no vitals filed for this visit.  There is no height or weight on file to calculate BMI.                   KARNOFSKY PERFORMANCE SCORE:  100% Normal, no complaints     Physical Exam     Vitals:    08/22/18 1300   BP: 127/57   Pulse: 54   Temp: 36.4 ???C (97.5 ???F)   SpO2: 100%     NAD, AOx3      Mri Pelvis Wo/w Contrast    Result Date: 08/20/2018  MRI PELVIS (MRI PROSTATE) Clinical Indication:  Male, 68 years old. Prostate cancer, rising PSA Recent PSA value(s):  1.94 ng/ml on 07/18/2018, 0.74 ng/ml on 04/26/2018, 0.67 ng/mL on 01/05/2018, and 0.20 ng/mL on 10/05/2017. Prior biopsy:  Prior biopsy on 05/12/2017 demonstrated adenocarcinoma in the right medial mid prostate, Gleason grade 4 + 3 =  7 (grade group 3), involving one of one core. Previously labeled lesion 1 on prior prostate MRI demonstrated benign prostatic tissue. Prior therapy:  Radiation therapy Technique: Multisequence and multiplanar MR imaging was obtained through the pelvis before and following the administration of gadolinium contrast material. IV Contrast:Multihance Bowel contrast: None Magnet:3 Tesla Siemens Comparison: MRI pelvis August 09, 2017, MRI pelvis April 15, 2017. Correlation also made with whole-body bone scan on 07/30/2018. FINDINGS: Please note that MRI is relatively insensitive for low grade, low volume disease such as Gleason 3 + 3 or Gleason 3 + 4 disease. Mild motion artifact degrades several sequences. Additional mild artifact from rectal gas degrades evaluation of the posterior inferior prostate on the diffusion-weighted sequences. Volume: 5.6 x 4.0 x 5.5 cm for a volume of 60 mL, previously 68 mL. Scattered foci of susceptibility in the prostate, compatible with fiducial markers. Peripheral zone: Mild increased, relatively diffuse T2 hypointensity throughout the peripheral zone compatible with interval radiation therapy. Persistent diffuse peripheral zone heterogeneity. No new or enlarging circumscribed diffusion signal abnormality or T2 hypointensity to suggest high-grade prostate carcinoma. Transition zone: Mild increased, relatively diffuse T2 hypointensity throughout the transition zone compatible with interval radiation therapy. Persistent enlarged, heterogeneous transition zone with several circumscribed BPH nodules. Previously labeled lesion 1 in the anterior transition zone is less well visualized, decreased in size with improved associated diffusion signal (series 6 image 14 and series 8 image 16). No increasing, ill-defined marked T2 hypointense lesion to suggest high-grade prostate malignancy in the transition zone. Lymph nodes: No pathologically enlarged lymph nodes.  Extraprostatic Extension: None. Osseous structures:  Development of enhancing mass in the left sacrum since prior MRI, which corresponds to focus of uptake on recent bone scan (series 46 image 71). Bilateral hip osteoarthrosis. Lower lumbar spondylosis.  Other: Small fat-containing left inguinal hernia. Minimally distended urinary bladder with mild bladder wall trabeculation.     1.  Post radiation appearance of the prostate with relatively diffuse, heterogeneous T2 hypointensity. No discrete new or enlarging mass to suggest high-grade prostatic malignancy. Decreased size and conspicuity of previously labeled lesion 1 in the right anterior transition zone since 04/15/2017, which was previously biopsied demonstrating benign prostatic tissue. 2.  No pelvic lymphadenopathy. 3.  Development of small enhancing mass in the left sacrum since 2019, which corresponds to area of uptake on recent whole-body bone scan, compatible with osseous metastasis. By my electronic signature, I attest that I have personally reviewed the images for this examination and formulated the interpretations and opinions expressed in this report  Finalized by Ancil Boozer, M.D. on 08/20/2018 10:00 AM. Dictated by Kathyrn Sheriff, M.D. on 08/20/2018 8:16 AM.     Nm Bone Scan Wholebody    Result Date: 07/30/2018  RADIONUCLIDE WHOLE BODY BONE SCAN with SPECT-CT OF THE CHEST AND PELVIS CLINICAL HISTORY: 68 year old male with prostate cancer. COMPARISON: None. Correlation is made with MRI of the pelvis dated 08/09/2017. TECHNIQUE: 3 hours after the uneventful intravenous administration of 26.7 mCi of Technetium 49M MDP, whole body anterior and posterior planar images were obtained. In addition, spot planar images of the rib cage and pelvis were obtained in the right and left lateral projection. SPECT-CT images of the chest and pelvis were also obtained. FINDINGS: There is physiologic uptake of labeled phosphate throughout the axial and appendicular skeleton.  There is physiologic uptake by both kidneys with excretion into the urinary bladder. Degenerative uptake are noted involving various levels of the cervical and thoracolumbar spine. Degenerative uptake are also noted  involving bilateral shoulders, sternoclavicular joints, right first carpometacarpal joint, bilateral wrists, hips, knees and feet. Focus of increased uptake is identified about the left superior sacrum medially. Focus of increased uptake is also noted about the proximal body of the sternum. SPECT-CT images of the chest demonstrate focal increased uptake about the superior aspect of the sternum the sternomanubrial junction which corresponds to area of sclerosis, likely degenerative in nature. SPECT-CT images of pelvis demonstrate focal abnormal increased uptake involving the medial left superior sacrum with mild peripheral area of increased sclerosis. Focus of increased uptake is also identified about the inferior medial right iliac bone near sacroiliac joint with associated osteophyte which is likely degenerative. Degenerative uptake are also noted about the visualized portions of L4-L5 and L5-S1.     1. Focal abnormal increased uptake in the medial left superior sacrum, suspicious for osseous metastasis. If indicated, MRI of the sacrum is suggested for further evaluation. 2. Focal increased uptake involving the superior body of the sternum at the sternomanubrial junction, likely degenerative in nature. 3. Focal increased uptake seen SPECT-CT involving the right inferior SI joint with associated osteophyte, reflective of degenerative change. 4. No scintigraphic evidence of disseminated osseous metastatic disease.  Finalized by Particia Jasper, M.D. on 07/30/2018 6:05 PM. Dictated by Particia Jasper, M.D. on 07/30/2018 4:29 PM.     Nm Bone Spect/ct Single Day    Lab Results   Component Value Date    PSA 1.94 07/18/2018    PSA 0.74 04/26/2018    PSA 0.67 01/05/2018    PSA 0.20 10/05/2017    PSA 4.86 (H) 07/14/2017            Assessment and Plan:    Mr. 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 ???He was treated with ST ADT and SBRT to the prostate. ???He has a had very rapid rise in the PSA over the past 2 months from 0.74, now up to 1.94 continuing to rise after a course of antibiotics.  He returns following re-staging scans.  There is suggestion of a single metastasis in the left sacrum.  This was not seen on staging MRI prior to treatment.    I discussed options if this is a metastasis, including cancer directed radiation, and systemic therapy.  I will arrange for a biopsy to confirm metastasis from prostate cancer.  I will also evaluate with Axumin PET.    1.  Biopsy  2.  Axumin PET    I will have Mr. Bocchino follow up with me after the scans.    Harriette Bouillon, MD  Attending Physician  Department of Radiation Oncology, [facility]    ATTESTATION    I personally performed the E/M including history, physical exam, and MDM. Staff name:  Harriette Bouillon, MD Date:  08/22/2018     Quality Measures Tracking    Late Radiation Toxicity:  Worst CTCAE toxicity grade in interval since last follow-up:    Grade 0: No Toxicity     Recurrence Status:   Distant recurrence without local recurrence

## 2018-08-23 ENCOUNTER — Encounter: Admit: 2018-08-23 | Discharge: 2018-08-23

## 2018-08-23 DIAGNOSIS — C61 Malignant neoplasm of prostate: Principal | ICD-10-CM

## 2018-08-23 DIAGNOSIS — I4891 Unspecified atrial fibrillation: Secondary | ICD-10-CM

## 2018-08-23 DIAGNOSIS — I10 Essential (primary) hypertension: Secondary | ICD-10-CM

## 2018-08-23 DIAGNOSIS — M1990 Unspecified osteoarthritis, unspecified site: Secondary | ICD-10-CM

## 2018-08-23 DIAGNOSIS — M549 Dorsalgia, unspecified: Secondary | ICD-10-CM

## 2018-08-23 NOTE — Progress Notes
Name: [name]          MRN: [id number]      DOB: 1950-04-28      AGE: 68 y.o.   DATE OF SERVICE: 08/23/2018    Subjective:             Reason for Visit:  Follow Up      Zachary Manning is a 68 y.o. male.     Cancer Staging  Prostate cancer [facility])  Staging form: Prostate, AJCC 8th Edition  - Clinical: Stage IIC (cT1c, cN0, cM0, PSA: 5.9, Grade Group: 3) - Unsigned      History of Present Illness  68 year old man with T1cN0M0 GS7 (4+3), PSA 5.9, GG2 prostate cancer (GS 4+3 cancer in a single core from biopsy) s/p SBRT to the prostate with short term ADT. After completion of therapy, he now has rising PSA up to almost 2.  He saw Dr Flonnie Hailstone yesterday who recommended re-staging with Axumin scan and repeat MRI. The repeat MRI suggested a single metastasis in the left sacrum.  This was not seen on staging MRI prior to treatment. Axumin scan has not been done yet. He has a IR biopsy scheduled for the possible metastatic lesion.       Review of Systems   Constitutional: Negative for activity change, appetite change, chills, diaphoresis, fatigue, fever and unexpected weight change.   HENT: Negative for congestion, hearing loss, mouth sores and sinus pressure.    Eyes: Negative for visual disturbance.   Respiratory: Negative for apnea, cough, chest tightness and shortness of breath.    Cardiovascular: Negative for chest pain, palpitations and leg swelling.   Gastrointestinal: Negative for abdominal pain, blood in stool, constipation, diarrhea, nausea, rectal pain and vomiting.   Endocrine: Negative.    Genitourinary: Negative for decreased urine volume, difficulty urinating, discharge, dysuria, enuresis, flank pain, frequency, genital sores, hematuria, penile pain, penile swelling, scrotal swelling, testicular pain and urgency.   Musculoskeletal: Positive for arthralgias, back pain, neck pain and neck stiffness. Negative for gait problem and myalgias.   Skin: Negative for rash and wound. Allergic/Immunologic: Negative.    Neurological: Negative for dizziness, tremors, seizures, syncope, weakness, light-headedness, numbness and headaches.   Hematological: Negative for adenopathy. Does not bruise/bleed easily.   Psychiatric/Behavioral: Negative for decreased concentration and dysphoric mood. The patient is not nervous/anxious.    All other systems reviewed and are negative.    Objective:         ??? aspirin EC 81 mg tablet Take 81 mg by mouth daily.   ??? atorvastatin (LIPITOR) 20 mg tablet TAKE 1 TABLET BY MOUTH ONCE DAILY   ??? ciprofloxacin (CIPRO) 500 mg tablet Take one tablet by mouth twice daily.   ??? COQ10 (UBIQUINOL) PO Take  by mouth daily.   ??? digoxin (LANOXIN) 125 mcg (0.125 mg) tablet Take one tablet by mouth daily. Please make pt aware of change   ??? docosahexanoic acid/epa (FISH OIL PO) Take  by mouth daily.   ??? flecainide (TAMBOCOR) 100 mg tablet TAKE 1 TABLET BY MOUTH THREE TIMES DAILY   ??? losartan (COZAAR) 100 mg tablet TAKE 1 TABLET BY MOUTH ONCE DAILY   ??? MAGNESIUM PO Take  by mouth daily.   ??? tamsulosin (FLOMAX) 0.4 mg capsule Take one capsule by mouth daily. Do not crush, chew or open capsules. Take 30 minutes following the same meal each day.   ??? verapamil CR (CALAN-SR) 120 mg tablet Take 1 tablet by mouth twice  daily     Vitals:    08/23/18 1132   BP: 131/57   BP Source: Arm, Left Upper   Patient Position: Sitting   Pulse: 61   Resp: 16   Temp: 36.6 ???C (97.8 ???F)   TempSrc: Temporal   SpO2: 98%   Weight: 91.4 kg (201 lb 9.6 oz)   Height: 198.1 cm (78)   PainSc: Two     Body mass index is 23.3 kg/m???.     Pain Score: Two  Pain Loc: Back(Lower back)    Fatigue Scale: 0-None     Physical Exam  Constitutional:       General: He is not in acute distress.     Appearance: He is well-developed. He is not diaphoretic.   HENT:      Head: Normocephalic and atraumatic.   Eyes:      General:         Right eye: No discharge.         Left eye: No discharge. Conjunctiva/sclera: Conjunctivae normal.      Pupils: Pupils are equal, round, and reactive to light.   Neck:      Musculoskeletal: Normal range of motion and neck supple.      Trachea: No tracheal deviation.   Cardiovascular:      Rate and Rhythm: Normal rate.   Pulmonary:      Effort: Pulmonary effort is normal. No respiratory distress.      Breath sounds: Normal breath sounds.   Chest:      Chest wall: No tenderness.   Abdominal:      General: Bowel sounds are normal. There is no distension.      Palpations: Abdomen is soft. There is no mass.      Tenderness: There is no abdominal tenderness. There is no guarding or rebound.      Hernia: No hernia is present.   Musculoskeletal: Normal range of motion.   Skin:     General: Skin is warm and dry.      Coloration: Skin is not pale.   Neurological:      Mental Status: He is alert and oriented to person, place, and time.   Psychiatric:         Behavior: Behavior normal.         Thought Content: Thought content normal.         Judgment: Judgment normal.               Assessment and Plan:    Problem   Prostate Cancer ([facility])      PSA history (up to last 5 years):   11/26/15  PSA  5.7 ng/mL  Free PSA:  0.95   %Free PSA:  16.7% free (per note)  08/23/16  PSA  5.88 ng/mL  Lab Results   Component Value Date/Time    PSA 1.94 07/18/2018 03:13 PM    PSA 0.74 04/26/2018 02:15 PM    PSA 0.67 01/05/2018 10:26 AM    PSA 0.20 10/05/2017 02:10 PM    PSA 4.86 (H) 07/14/2017 01:27 PM       09/28/16 -- Initial visit with Dr. Jimmey Ralph. DRE right firm nodule 8mm. Plan for template biopsy.    10/05/16  Final Diagnosis:     A. Prostate, left medial apex, biopsy:   Benign prostatic glands and stroma.   Negative for high grade PIN or malignancy. ???     B. Prostate, left medial mid, biopsy:   Benign prostatic glands  and stroma.   Negative for high grade PIN or malignancy. ???     C. Prostate, left medial base, biopsy:   Benign prostatic glands and stroma. Negative for high grade PIN or malignancy. ???     D. Prostate, left lateral apex, biopsy:   Benign prostatic glands and stroma.   Negative for high grade PIN or malignancy. ??? ???     E. Prostate, left lateral mid, biopsy:   Benign prostatic glands and stroma.   Negative for high grade PIN or malignancy. ??? ???     F. Prostate, left lateral base, biopsy:   Benign prostatic glands and stroma.   Negative for high grade PIN or malignancy. ??? ???     G. Prostate, right medial apex, biopsy:   Benign prostatic glands and stroma.   Negative for high grade PIN or malignancy. ???     H. 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   10% of the needle core tissue and measuring 2 mm in length. See comment     I. Prostate, right medial base, biopsy:   Benign prostatic glands and stroma.   Negative for high grade PIN or malignancy. ??? ???     J. Prostate, right lateral apex, biopsy:   Benign prostatic glands and stroma.   Negative for high grade PIN or malignancy. ???     K. Prostate, right lateral mid, biopsy:   Benign prostatic glands and stroma.   Negative for high grade PIN or malignancy. ??? ???     L. Prostate, right lateral base, biopsy:   Benign prostatic glands and stroma.   Negative for high grade PIN or malignancy.     Mri of prostate:  IMPRESSION    1. Lenticular, moderately T2 hypointense lesion within the anterior   transition zone, mid gland to base, which may represent asymmetric   anterior fibromuscular stroma, though meets the T2 imaging criteria for a   PI-RADS 5 abnormality. Associated broad-based abutment and mild bulging of   the anterior prostate capsule without gross extraprostatic extension. This   lesion was contoured in DynaCAD for potential fusion biopsy.     05/11/2017: Fusion biopsy: 1of 13 cores positive.    Prostate, right medial mid, core biopsy: ??? 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     Lupron injection: 06/16/2017    SBRT with Dr. Flonnie Hailstone, 08/21/2017 through 08/31/2017             Prostate cancer [facility])  68 year old man with T1cN0M0 GS7 (4+3), PSA 5.9, GG2 prostate cancer (GS 4+3 cancer in a single core from biopsy) s/p SBRT to the prostate with short term ADT now with biochemical recurrence who presents for evaluation by Dr Jimmey Ralph. His most recent MRI ordered by Dr Flonnie Hailstone demonstrated a lesion in left sacrum concerning for a bony metastasis. An Axumin scan has been ordered but not performed. Discussed with patient that should the lesion in his left sacrum be a metastatic lesion (he has upcoming IR biopsy), then he will need systemic therapy vs. localized radiation therapy, and there is no role for surgical intervention in this scenario. The patient verbalized understanding and all questions were answered.    - Continue following Dr Flonnie Hailstone with radiation oncology.  - RTC PRN  Patient seen and discussed with Dr Jimmey Ralph,    Minerva Ends, MD  URO PGY4    ATTESTATION    I personally performed the key portions of the E/M visit, discussed case with resident and concur with resident documentation of history, physical exam, assessment, and treatment plan unless otherwise noted.     Zachary Manning is overall doing well at this time.  His PSA has risen at a concerning rate and his most recent post-radiation imaging is concerning for an osseous metastasis.  He is scheduled for a biopsy and a PET/CT.  Pending these results his treatment plan may vary.  I did discuss when salvage prostatectomy may be indicated and in particular how it would only be in the absence of metastatic disease and only after confirmation of local recurrence with a biopsy.  He voiced understanding.  His follow-up will be dependent on his evaluation.      Staff name:  Myna Hidalgo, MD Date:  08/23/2018

## 2018-08-23 NOTE — Assessment & Plan Note
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 His most recent MRI ordered by Dr Crisoforo Oxford demonstrated a lesion in left sacrum concerning for a bony metastasis. An Axumin scan has been ordered but not performed. Discussed with patient that should the lesion in his left sacrum be a metastatic lesion (he has upcoming IR biopsy), then he will need systemic therapy vs. localized radiation therapy, and there is no role for surgical intervention in this scenario. The patient verbalized understanding and all questions were answered.    - Continue following Dr Crisoforo Oxford with radiation oncology.  - RTC PRN

## 2018-08-23 NOTE — Progress Notes
Interventional Radiology Outpatient Procedure Review Assessment        Reason for consult: Evaluate for left sacral osseous lesion in setting of prostate cancer.      History of present illness:  Zachary Manning is a 68 y.o. male patient with a history significant for prostate cancer.      Assessment:   - Review of imaging: Yes, MRI August 17, 2018 and WB bone scan July 30, 2018.   - Review of recent labs and allergies: No pertinent drug allergies. No current labs to review. If none available, is this information necessary prior to procedure: No.  - Review of anticoagulation medications: Aspirin 81 mg.  - Previous ASA score if available: None (If ASA IV, anesthesia will run sedation for procedure if sedation indicated).  - Previous IR Anesthetic/Sedation History (if applicable): None  - Case discussed with referring Physician (Y/N): No  - Contraindications to procedure: No      Plan:  - This case has been reviewed and approved for left sacral bone lesion biopsy  - Patient position and modality: prone, CT  - Intra-procedural Sedation/Medication Plan: Moderate sedation  - Procedure time frame: <10 days, 10-14 days, 14-30 days  - Anticoagulation: Per IR peri procedural anticoagulation management protocol  - Labs: Per IR pre procedural lab protocol  - Additional requests after review: No    Lab/Radiology/Other Diagnostic Tests:  Labs:  No pertinent labs              L. , MD

## 2018-08-24 ENCOUNTER — Ambulatory Visit: Admit: 2018-08-09 | Discharge: 2018-08-24

## 2018-08-24 DIAGNOSIS — C61 Malignant neoplasm of prostate: Principal | ICD-10-CM

## 2018-08-27 NOTE — Patient Education
Dear Mr Bolar,    Thank you for choosing [facility] Interventional Radiology for your procedure. Your appointment information is listed below:    Appointment Date: 09/03/2018  Appointment Time: 2:15PM  Arrival Time: 1:15PM  Location:     ? Main Campus: [address] [place], [place]  [address]  Parking: [address]age    INTERVENTIONAL RADIOLOGY  PRE-PROCEDURE INSTRUCTIONS SEDATION    You are scheduled for a procedure in Interventional Radiology with procedural sedation.  Please follow these instructions and any direction from your Primary Care/Managing Physician.  If you have questions about your procedure or need to reschedule please call [phone].    Medication Instructions:   You may take the following medications with a small sip of water:  Continue taking your normal morning medications as directed.      Diet Instructions:  a. (8) hours 6AM before your procedure, stop your regular diet and start a clear liquid diet.  b. (6) hours before your procedure, discontinue tube feedings and chewing tobacco.  c. (2) hours 12PM before your procedure discontinue clear liquids.  You should have nothing by mouth. This includes GUM or CANDY.     Clear Liquid Diet    Water  Apple or White Grape Juice  Coffee or tea without cream   Tea  White Cranberry Juice  Chicken Bouillon or Broth (no noodles)  Soda Pop  Popsicles   Beef Bouillon or Broth (no noodles)    Day of Exam Instructions:  1. Bathe or shower with an antibacterial soap prior to your appointment.  2. If you have a history of Obstructive Sleep Apnea (OSA) bring your CPAP/BIPAP.   3. Bring a list of your current medications and the dosages.  4. Wear comfortable clothing and leave valuables at home.  5. Arrive (1) hour prior to your appointment.  This time will be spent registering, interviewing, assessing, educating and preparing you for the test.  ? You will be with [place] anywhere from 30 minutes to 6 hours after your exam depending on your procedure.  6. You may be sedated for the procedure. A responsible adult must drive you home (no Benedetto Goad, taxis or buses are allowed) and stay with you overnight. If you do not have a driver we will be unable to perform your procedure.   7. You will not be able to return to work or drive the same day if receiving sedation.

## 2018-08-27 NOTE — Progress Notes
Interventional Radiology Outpatient Scheduling Checklist      1.  Name of Procedure(s):   CT Guided Left Sacrum area Biopsy    08/23/2018 8:28 AM CDT by Delozier, Einar Grad., MD     Approved? Yes   Attending Provider Reviewing the Case: Alli   Case Type: Body   Modality: CT   Position: Prone   Sedation Type: Moderate   Preferred Location: [facility]   Physician: Any   Recommended Specimen Core   Comments Will need bone drill           2.  Date of Procedure:   09/03/2018      3.  Arrival Time:   8016      5.  Procedure Time:  5374      8.  Correct Procedural Room Assignment: IR [facility] Room #5        6.  Blood Thinners Triaged and instructed per protocol: Y/N/NA:  NA  Confirmed accurate instructions sent to patient: Y/N:  NA       7.  Procedure Order Verified: Y/N:  Yes      9.  Patient instructed to have a driver: Y/N/NA:  Yes    10.  Patient instructed on NPO status: Y/N/NA:  Yes  0600 & 1200  Confirmed accurate instructions sent to patient: Y/N:  Yes    11.  Specimen needed: Y/N/NA:  Yes   Verified Order placed: Y/N:  Yes    12.  Allergies Verified:  Y/N:  Yes    13.  Is there an Iodine Allergy: Y/N:  No  Does the Procedure Require contrast: Y/N:  NO  If so, was the IR- Contrast Allergy Pre-Procedure Medication protocol ordered: Y/NA:  NA    14.  Does the patient have labs according to IR Pre-procedure Laboratory Parameter policy: Y/N/NA:  N/A  If No, was the patient instructed to obtain labs prior to procedure: Y/N/NA:  NA     15.  Will the patient need to be admitted or have a possible admission: Y/N:  No  If yes, confirmed accurate instructions sent to patient: Y/N/NA:  NA     16.  Patient States Understanding: Y/N:  Yes    17.  History of OSA:  Y/N:  No  If yes, confirm request to bring CPAP sent to patient: Y/N/NA:  NA    18. Patient declines electronic procedure instructions: Y/N:  No

## 2018-08-31 ENCOUNTER — Encounter: Admit: 2018-08-31 | Discharge: 2018-08-31

## 2018-08-31 DIAGNOSIS — C61 Malignant neoplasm of prostate: Secondary | ICD-10-CM

## 2018-08-31 DIAGNOSIS — Z923 Personal history of irradiation: Secondary | ICD-10-CM

## 2018-08-31 MED ORDER — RP DX F-18 FLUCICLOVINE MCI
10 | Freq: Once | INTRAVENOUS | 0 refills | Status: CP
Start: 2018-08-31 — End: ?

## 2018-09-03 ENCOUNTER — Encounter: Admit: 2018-09-03 | Discharge: 2018-09-03

## 2018-09-03 ENCOUNTER — Ambulatory Visit: Admit: 2018-09-03 | Discharge: 2018-09-03

## 2018-09-03 DIAGNOSIS — C61 Malignant neoplasm of prostate: Secondary | ICD-10-CM

## 2018-09-03 DIAGNOSIS — I10 Essential (primary) hypertension: Secondary | ICD-10-CM

## 2018-09-03 DIAGNOSIS — M549 Dorsalgia, unspecified: Secondary | ICD-10-CM

## 2018-09-03 DIAGNOSIS — M1990 Unspecified osteoarthritis, unspecified site: Secondary | ICD-10-CM

## 2018-09-03 DIAGNOSIS — M898X8 Other specified disorders of bone, other site: Principal | ICD-10-CM

## 2018-09-03 DIAGNOSIS — I4891 Unspecified atrial fibrillation: Secondary | ICD-10-CM

## 2018-09-03 MED ORDER — MIDAZOLAM 1 MG/ML IJ SOLN
1 mg | Freq: Once | INTRAVENOUS | 0 refills | Status: CP
Start: 2018-09-03 — End: ?
  Administered 2018-09-03: 20:00:00 1 mg via INTRAVENOUS

## 2018-09-03 MED ORDER — FENTANYL CITRATE (PF) 50 MCG/ML IJ SOLN
0 refills | Status: CP
Start: 2018-09-03 — End: ?
  Administered 2018-09-03 (×2): 25 ug via INTRAVENOUS

## 2018-09-03 MED ORDER — MIDAZOLAM 1 MG/ML IJ SOLN
0 refills | Status: CP
Start: 2018-09-03 — End: ?
  Administered 2018-09-03: 20:00:00 1 mg via INTRAVENOUS

## 2018-09-03 NOTE — Progress Notes
Procedural education completed with patient. All questions answered. Patient verbalized understanding. Safety precautions in place. Call light within reach. Will continue to monitor.

## 2018-09-03 NOTE — Progress Notes
Sedation physician present in room.  Recent vitals and patient condition reviewed between sedating physician and nurse.  Reassessment completed.  Determination made to proceed with planned sedation.

## 2018-09-03 NOTE — H&P (View-Only)
Pre Procedure History and Physical/Sedation Plan      Procedure Date:  09/03/2018    Planned Procedure(s): Left sacral percutaneous mass biopsy    Indication for exam: Work up, hx of prostate cancer, possibility of metastasis   ________________________________________________________________    Chief Complaint:   Sacral mass     Previous Anesthetic/Sedation History:  Reviewed     Allergies:  Lisinopril and Sulfa dyne  Medications:  Scheduled Meds:Continuous Infusions:  PRN and Respiratory Meds:       Vital Signs:  Last Filed Vital Signs: 24 Hour Range           Pre-procedure anxiolysis plan: Midazolam  Sedation/Medication Plan: Fentanyl, Lidocaine and Midazolam  Personal history of sedation complications: Denies adverse event.   Family history of sedation complications: Denies adverse event.   Medications for Reversal: Naloxone and Flumazenil  Discussion/Reviews:  Physician has discussed risks and alternatives of this type of sedation and above planned procedures with patient    NPO Status: Acceptable  Airway:  airway assessment performed  Mallampati II (soft palate, uvula, fauces visible)  Head and Neck: no abnormalities noted  Mouth: no abnormalities noted   Anesthesia Classification:  ASA III (A patient with a severe systemic disease that limits activity, but is not incapacitating)  Pregnancy Status: N/A    Lab/Radiology/Other Diagnostic Tests  Labs:  Relevant labs reviewed    I have examined the patient, and there are no significant changes in their condition, from the previous H&P performed on 7/28.    Vernetta Honey, APRN-NP  Pager [phone]

## 2018-09-03 NOTE — Patient Instructions
INTERVENTIONAL RADIOLOGY DISCHARGE INSTRUCTIONS  PERCUTANEOUS BIOPSY   A percutaneous biopsy is a procedure in which a tiny sample of tissue is taken by using a needle inserted through your skin (percutaneously) into the affected area.??? The radiologist will determine whether the procedure is to be done using ultrasound or CT guidance.??? Your biopsy is from the following area:???_______________________________________________  POST-PROCEDURE ACTIVITY:  ??? A responsible adult must drive you home after the procedure.??? You should not drive or operate heavy machinery or do anything that requires concentration for at least 24 hours after receiving sedation or anesthesia.  ??? It is recommended that a responsible adult be with you until tomorrow morning.  ??? Other??? activity restrictions such as lifting restrictions will depend upon the biopsy area and will be determined by the physician after your procedure:???________________________________________________________???????????????????????????????????????  ???POST-PROCEDURE SITE CARE:  ??? You will have a small bandage over the site.??? Keep this dry.??? You may remove it in 24 hours.  ??? You may shower in 24 hours, after removing the bandage.  ??? Do not submerge the site underwater for 1 week (no tub bath, swimming/hot tub, etc.)  ??? Be sure your hands are clean when touching near the site.  ??? Do not use ointments, creams or powders on the puncture site.???  DIET/MEDICATIONS:  ??? You may resume your previous diet after the procedure unless otherwise restricted.  ??? If you receive sedation or narcotic pain medications, avoid any foods or beverages containing alcohol for at least 24 hours after the procedure.  ??? Please see the Medication Reconciliation sheet for instructions on resuming your home medications.???  CALL THE DOCTOR IF:???  ??? Bright red blood has soaked the bandage.  ??? You have severe pain not relieved by medication.??? Some soreness or tenderness at the site is to be expected for several days. ??? You have signs of infection such as: Chills, fever greater than 101???F, body aches, redness, swelling or warmth at the puncture site or pus draining from the site.??????  You or your caregiver should call 911 if you have any severe symptoms such as excessive bleeding, severe dizziness, trouble breathing or loss of consciousness.  For any of the above symptoms or for problems or concerns related to the procedure,??? call??? [phone] for Monday-Friday 7-5.??? After-hours and weekends, please call??? [phone] and ask for the Interventional Radiology Resident on-call.

## 2018-09-03 NOTE — Progress Notes
Ok to be discharged home per MD protocol. Patient is awake and alert. Denies pain. Vital signs stable. Tolerating food and fluids well. Dressing is clean, dry, and intact. Patient verbalized understanding of discharge instructions. Advised to call MD with any questions or concerns. Advised to present to ER if fever, significant pain, or signs of infection present. Family to drive patient home. IV removed and transported to entrance with all belongings.

## 2018-09-03 NOTE — Other
Immediate Post Procedure Note    Date:  09/03/2018                                         Attending Physician:   Cline Crock, MD  Performing Provider:  Rachel Bo, DO    Consent:  Consent obtained from patient.  Time out performed: Consent obtained, correct patient verified, correct procedure verified, correct site verified, patient marked as necessary.  Pre/Post Procedure Diagnosis:  Left sacrum bone lesion  Indications:  As above    Anesthesia: Local 10 mL 1% lidocaine without epinephrine  Procedure(s):  Left sacrum bone lesion biopsy  Findings:  2 core samples obtained     Estimated Blood Loss:  None/Negligible  Specimen(s) Removed/Disposition:  Yes, sent to pathology  Complications: None  Patient Tolerated Procedure: Well  Post-Procedure Condition:  stable     , DO  Pager

## 2018-09-09 DIAGNOSIS — C61 Malignant neoplasm of prostate: Principal | ICD-10-CM

## 2018-09-10 ENCOUNTER — Ambulatory Visit: Admit: 2018-09-10 | Discharge: 2018-09-10 | Payer: MEDICARE

## 2018-09-10 ENCOUNTER — Encounter: Admit: 2018-09-10 | Discharge: 2018-09-10 | Payer: MEDICARE

## 2018-09-10 DIAGNOSIS — M1990 Unspecified osteoarthritis, unspecified site: Secondary | ICD-10-CM

## 2018-09-10 DIAGNOSIS — M549 Dorsalgia, unspecified: Secondary | ICD-10-CM

## 2018-09-10 DIAGNOSIS — I4891 Unspecified atrial fibrillation: Secondary | ICD-10-CM

## 2018-09-10 DIAGNOSIS — I10 Essential (primary) hypertension: Secondary | ICD-10-CM

## 2018-09-10 LAB — PROSTATIC SPECIFIC ANTIGEN-PSA: Lab: 1.8 ng/mL (ref <4.01)

## 2018-09-10 NOTE — Progress Notes
Review of Systems   Psychiatric/Behavioral: The patient is nervous/anxious.    All other systems reviewed and are negative.

## 2018-09-11 NOTE — Progress Notes
Radiation Oncology Follow Up Note   Date: 09/10/2018       Zachary Manning is a 68 y.o. male.     The encounter diagnosis was Prostate cancer (HCC).  Staging: Cancer Staging  Prostate cancer [facility])  Staging form: Prostate, AJCC 8th Edition  - Clinical: Stage IIC (cT1c, cN0, cM0, PSA: 5.9, Grade Group: 3) - Unsigned        Subjective:          Cancer Staging  Prostate cancer (HCC)  Staging form: Prostate, AJCC 8th Edition  - Clinical: Stage IIC (cT1c, cN0, cM0, PSA: 5.9, Grade Group: 3) - Unsigned      History of Present Illness    History of Present Illness  Site Treatment Dates [employer] Dose/???Fraction (cGy) Total Dose (cGy) Fractions Missed Treatment Days   Prostate 08/21/17 - 08/31/17 SBRT 10X [phone]   Concurrent systemic therapy:???ST ADT  ???  Zachary Manning returns today for follow-up.  He is doing well.  He continues to have the unexpected high-volume watery ejaculate.  He has had no other health changes.  He does report some right-sided groin pain and right buttock area pain.  Due to rising PSA, we have been concerned about recurrence in this patient.  He underwent a MRI, which demonstrated a lesion in the left sacrum.  He subsequently had Axumin PET as well as biopsy.       Review of Systems  Per nursing note    Objective:         ??? aspirin EC 81 mg tablet Take 81 mg by mouth daily.   ??? atorvastatin (LIPITOR) 20 mg tablet TAKE 1 TABLET BY MOUTH ONCE DAILY   ??? ciprofloxacin (CIPRO) 500 mg tablet Take one tablet by mouth twice daily.   ??? COQ10 (UBIQUINOL) PO Take  by mouth daily.   ??? digoxin (LANOXIN) 125 mcg (0.125 mg) tablet Take one tablet by mouth daily. Please make pt aware of change   ??? docosahexanoic acid/epa (FISH OIL PO) Take  by mouth daily.   ??? flecainide (TAMBOCOR) 100 mg tablet TAKE 1 TABLET BY MOUTH THREE TIMES DAILY   ??? losartan (COZAAR) 100 mg tablet TAKE 1 TABLET BY MOUTH ONCE DAILY   ??? MAGNESIUM PO Take  by mouth daily. ??? tamsulosin (FLOMAX) 0.4 mg capsule Take one capsule by mouth daily. Do not crush, chew or open capsules. Take 30 minutes following the same meal each day.   ??? verapamil CR (CALAN-SR) 120 mg tablet Take 1 tablet by mouth twice daily     Vitals:    09/10/18 1219   BP: (!) 152/65   Pulse: 65   Temp: 36.2 ???C (97.2 ???F)   SpO2: 99%   Weight: 90.7 kg (200 lb)   PainSc: Two     Body mass index is 23.11 kg/m???.     Pain Score: Two  Pain Loc: Back     Fatigue Scale: 2    KARNOFSKY PERFORMANCE SCORE:  90% Able to carry on normal activity; minor signs of disease     Physical Exam     Vitals:    09/10/18 1219   BP: (!) 152/65   Pulse: 65   Temp: 36.2 ???C (97.2 ???F)   SpO2: 99%     NAD, AOx3    Nm Pet Prostate Ca    Result Date: 08/31/2018  PET/CT NECK, CHEST, ABDOMEN AND PELVIS (PROSTATE CANCER) CLINICAL HISTORY:  Male, 68 years old. Prostate cancer. Prior radiation  therapy. Rising PSA. RADIOPHARMACEUTICAL:  10.8 mCi F-18 Fluciclovine IV. TECHNIQUE:  Beginning approximately 3 minutes after tracer administration, routine whole body PET/CT imaging was performed from the level of the base of the skull to the upper thighs.  PET images were reviewed in standard orthogonal projections.  Low dose non-contrast CT imaging was performed for attenuation correction and localization purposes. COMPARISON: MRI pelvis dated 08/17/2018 and total-body bone scan dated 07/30/2018. FINDINGS: Head/Neck: No suspicious lesions are identified in this region. Chest: No suspicious lesions are identified within the chest. Abdomen/Pelvis: No suspicious lesions are seen within the abdomen or pelvis. No evidence of tracer avid local tumor recurrence or tracer avid adenopathy. Osseous Structures: The suspicious lesion within the left sacrum on the MRI pelvis and prior bone scan demonstrates no significant tracer uptake greater than the normal bone marrow. This lesion is slightly sclerotic on the attenuation CT images. Additional significant low dose CT findings: Nonobstructing left lower pole renal calculi. Uncorrected PET images:  The uncorrected PET images demonstrate no additional abnormality.      1. The small lesion noted within the left sacrum on the recent MRI and bone scan is sclerotic on the attenuation CT images but demonstrates no significant increased uptake above background bone marrow activity. This lesion remains suspicious for metastatic disease given the findings noted on the MRI and bone scan. Biopsy is again recommended. 2. No additional tracer avid metastases or evidence of local tumor recurrence within the prostate.  Finalized by Clemens Catholic, M.D. on 08/31/2018 2:26 PM. Dictated by Clemens Catholic, M.D. on 08/31/2018 2:11 PM.     ########################################################################   Final Diagnosis:     A. Bone, left sacrum bone 2 cores, biopsy:   Focal slight bone thickening   There is no evidence of malignancy. See comment     Lab Results   Component Value Date    PSA 1.80 09/10/2018    PSA 1.94 07/18/2018    PSA 0.74 04/26/2018    PSA 0.67 01/05/2018    PSA 0.20 10/05/2017    PSA 4.86 (H) 07/14/2017            Assessment and Plan:    Zachary Manning is a 68 year old man with T1cN0M0 GS7 (4+3) PSA 5.88, group IIC???prostate cancer. ???He was treated with ST ADT and SBRT to the prostate.??????He has a had very rapid rise in the PSA up to 1.94.      Restaging work-up demonstrated a lesion in the left sacrum on MRI.  This was not hypermetabolic on Axumin PET, and biopsy was negative.  Meanwhile, PSA today was slightly lower at 1.80.  I think there is some likelihood that this is all a PSA bounce.  I recommend to continue to monitor the PSA.  I will have Zachary Manning follow-up with me in 3 months.    Harriette Bouillon, MD  Attending Physician  Department of Radiation Oncology, [facility]  ATTESTATION    I personally performed the E/M including history, physical exam, and MDM.    Staff name:  Harriette Bouillon, MD Date:  09/10/2018

## 2018-09-12 ENCOUNTER — Encounter: Admit: 2018-09-12 | Discharge: 2018-09-12

## 2018-09-12 NOTE — Telephone Encounter
Contacted pt who states he is concerned as he continues to have clear ejaculate that he describes as consistent w/ his pre-radiation ejaculate.  He expressed deep concern as he states he has spoken w/ Dr. Crisoforo Oxford, who, pt states, told him he should not be having any ejaculate post radiation.  Explained that I will reach out to Dr. Jerline Pain and we will contact him with his response.  Pt appreciative of assistance and agreeable to plan.

## 2018-09-12 NOTE — Telephone Encounter
I called Mr. Pitkin and discussed his continued ejaculate production.  He is concerned that this may indicate that his prostate cancer is recurred.  I explained that given that his PET is no avid in the prostate, that there is a low probability of prostate cancer that is active.  I discussed that there is a disconnect between ejaculation and cancer recurrence.  I recommended that we continue to monitor his PSA.

## 2018-09-13 ENCOUNTER — Encounter: Admit: 2018-09-13 | Discharge: 2018-09-13

## 2018-09-13 ENCOUNTER — Ambulatory Visit: Admit: 2018-09-13 | Discharge: 2018-09-14

## 2018-09-13 DIAGNOSIS — I10 Essential (primary) hypertension: Secondary | ICD-10-CM

## 2018-09-13 DIAGNOSIS — R002 Palpitations: Secondary | ICD-10-CM

## 2018-09-13 DIAGNOSIS — R001 Bradycardia, unspecified: Secondary | ICD-10-CM

## 2018-09-13 DIAGNOSIS — M1990 Unspecified osteoarthritis, unspecified site: Secondary | ICD-10-CM

## 2018-09-13 DIAGNOSIS — I4892 Unspecified atrial flutter: Secondary | ICD-10-CM

## 2018-09-13 DIAGNOSIS — M549 Dorsalgia, unspecified: Secondary | ICD-10-CM

## 2018-09-13 DIAGNOSIS — I480 Paroxysmal atrial fibrillation: Secondary | ICD-10-CM

## 2018-09-13 DIAGNOSIS — I4891 Unspecified atrial fibrillation: Secondary | ICD-10-CM

## 2018-09-13 DIAGNOSIS — C61 Malignant neoplasm of prostate: Secondary | ICD-10-CM

## 2018-09-13 MED ORDER — LOSARTAN 100 MG PO TAB
100 mg | ORAL_TABLET | Freq: Every day | ORAL | 3 refills | 30.00000 days | Status: DC
Start: 2018-09-13 — End: 2019-07-04

## 2018-09-13 MED ORDER — VERAPAMIL 120 MG PO TBER
120 mg | ORAL_TABLET | Freq: Two times a day (BID) | ORAL | 3 refills | 90.00000 days | Status: DC
Start: 2018-09-13 — End: 2019-07-04

## 2018-09-13 MED ORDER — DIGOXIN 125 MCG (0.125 MG) PO TAB
125 ug | ORAL_TABLET | Freq: Every day | ORAL | 3 refills | 30.00000 days | Status: DC
Start: 2018-09-13 — End: 2019-07-04

## 2018-09-13 MED ORDER — ATORVASTATIN 20 MG PO TAB
20 mg | ORAL_TABLET | Freq: Every day | ORAL | 3 refills | Status: DC
Start: 2018-09-13 — End: 2019-07-04

## 2018-09-13 MED ORDER — FLECAINIDE 100 MG PO TAB
100 mg | ORAL_TABLET | Freq: Three times a day (TID) | ORAL | 3 refills | 30.00000 days | Status: DC
Start: 2018-09-13 — End: 2019-07-04

## 2018-09-13 NOTE — Progress Notes
Date of Service: 09/13/2018    Zachary Manning is a 68 y.o. male.       HPI     [name] is doing well from a cardiac standpoint.  He does have a remote history of atrial fibrillation, paroxysmal, patient has been in normal sinus rhythm for several years.  He has been continued on flecainide for rhythm control, and we have been using digoxin and verapamil for rate control.  Patient does not report any recurrent episodes of heart palpitations.  His CHA2DS2-VASc score is 1 and he currently takes a baby aspirin.    Patient was evaluated with a perfusion imaging study in February 2020, he was actually able to do a Bruce protocol and he exercised to stage III with an appropriate hemodynamic response, there are no ischemic electrocardiographic changes that occurred, the perfusion pattern was negative for ischemia and ejection fraction was normal at 64%.    Patient does not have currently any symptoms of chest pain, no heart palpitations, no presyncope or syncope.    He does have prostate cancer and has completed radiation therapy.                  Vitals:    09/13/18 1428 09/13/18 1434   BP: 122/68 120/74   BP Source: Arm, Left Upper Arm, Right Upper   Pulse: 60    Temp: 36.9 ???C (98.4 ???F)    SpO2: 96%    Weight: 90.6 kg (199 lb 12.8 oz)    Height: 1.956 m (6' 5)    PainSc: Zero      Body mass index is 23.69 kg/m???.     Past Medical History  Patient Active Problem List    Diagnosis Date Noted   ??? Prostate cancer (HCC) 09/28/2016       PSA history (up to last 5 years):   11/26/15  PSA  5.7 ng/mL  Free PSA:  0.95   %Free PSA:  16.7% free (per note)  08/23/16  PSA  5.88 ng/mL  Lab Results   Component Value Date/Time    PSA 1.94 07/18/2018 03:13 PM    PSA 0.74 04/26/2018 02:15 PM    PSA 0.67 01/05/2018 10:26 AM    PSA 0.20 10/05/2017 02:10 PM    PSA 4.86 (H) 07/14/2017 01:27 PM       09/28/16 -- Initial visit with Dr. Jimmey Ralph. DRE right firm nodule 8mm. Plan for template biopsy.    10/05/16  Final Diagnosis: A. Prostate, left medial apex, biopsy:   Benign prostatic glands and stroma.   Negative for high grade PIN or malignancy. ???     B. Prostate, left medial mid, biopsy:   Benign prostatic glands and stroma.   Negative for high grade PIN or malignancy. ???     C. Prostate, left medial base, biopsy:   Benign prostatic glands and stroma.   Negative for high grade PIN or malignancy. ???     D. Prostate, left lateral apex, biopsy:   Benign prostatic glands and stroma.   Negative for high grade PIN or malignancy. ??? ???     E. Prostate, left lateral mid, biopsy:   Benign prostatic glands and stroma.   Negative for high grade PIN or malignancy. ??? ???     F. Prostate, left lateral base, biopsy:   Benign prostatic glands and stroma.   Negative for high grade PIN or malignancy. ??? ???     G. Prostate, right medial apex, biopsy:   Benign  prostatic glands and stroma.   Negative for high grade PIN or malignancy. ???     H. 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   10% of the needle core tissue and measuring 2 mm in length. See comment     I. Prostate, right medial base, biopsy:   Benign prostatic glands and stroma.   Negative for high grade PIN or malignancy. ??? ???     J. Prostate, right lateral apex, biopsy:   Benign prostatic glands and stroma.   Negative for high grade PIN or malignancy. ???     K. Prostate, right lateral mid, biopsy:   Benign prostatic glands and stroma.   Negative for high grade PIN or malignancy. ??? ???     L. Prostate, right lateral base, biopsy:   Benign prostatic glands and stroma.   Negative for high grade PIN or malignancy.     Mri of prostate:  IMPRESSION    1. Lenticular, moderately T2 hypointense lesion within the anterior   transition zone, mid gland to base, which may represent asymmetric   anterior fibromuscular stroma, though meets the T2 imaging criteria for a PI-RADS 5 abnormality. Associated broad-based abutment and mild bulging of   the anterior prostate capsule without gross extraprostatic extension. This   lesion was contoured in DynaCAD for potential fusion biopsy.     05/11/2017: Fusion biopsy: 1of 13 cores positive.    Prostate, right medial mid, core biopsy: ???   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     Lupron injection: 06/16/2017    SBRT with Dr. Flonnie Hailstone, 08/21/2017 through 08/31/2017         ??? Atrial flutter (HCC) 02/11/2014   ??? Poor dentition 10/11/2012   ??? Heart palpitations 10/11/2012   ??? Atrial fibrillation (HCC) 02/02/2011   ??? Hypertension 02/02/2011   ??? Bradycardia 02/02/2011   ??? ETOH abuse 02/02/2011   ??? Fatigue 02/02/2011         Review of Systems   Constitution: Negative.   HENT: Negative.    Eyes: Negative.    Cardiovascular: Negative.    Respiratory: Negative.    Endocrine: Negative.    Hematologic/Lymphatic: Negative.    Skin: Negative.    Musculoskeletal: Positive for arthritis, back pain, joint pain and neck pain.   Gastrointestinal: Negative.    Genitourinary: Negative.    Neurological: Negative.    Psychiatric/Behavioral: Negative.    Allergic/Immunologic: Negative.        Physical Exam  General Appearance: normal in appearance  Skin: warm, moist, no ulcers or xanthomas  Eyes: conjunctivae and lids normal, pupils are equal and round  Lips & Oral Mucosa: no pallor or cyanosis  Neck Veins: neck veins are flat, neck veins are not distended  Chest Inspection: chest is normal in appearance  Respiratory Effort: breathing comfortably, no respiratory distress  Auscultation/Percussion: lungs clear to auscultation, no rales or rhonchi, no wheezing  Cardiac Rhythm: regular rhythm and normal rate  Cardiac Auscultation: S1, S2 normal, no rub, no gallop  Murmurs: no murmur  Carotid Arteries: normal carotid upstroke bilaterally, no bruit Lower Extremity Edema: no lower extremity edema  Abdominal Exam: soft, non-tender, no masses, bowel sounds normal  Liver & Spleen: no organomegaly  Language and Memory: patient responsive and seems to comprehend  information  Neurologic Exam: neurological assessment grossly intact      Cardiovascular Studies  Twelve-lead EKG demonstrates normal sinus rhythm, no ST segment T wave changes    Problems Addressed Today  Encounter Diagnoses   Name Primary?   ??? Essential hypertension Yes   ??? Paroxysmal atrial fibrillation (HCC)    ??? Atrial flutter, unspecified type (HCC)    ??? Heart palpitations    ??? Bradycardia    ??? Prostate cancer (HCC)        Assessment and Plan     In summary: This is a 68 year old white male that has a history of paroxysmal atrial fibrillation, he has not had any documented episode 01/22/2013 (the EKG performed on the time actually revealed CTI dependent flutter, this resolved with conservative management, patient did not undergo RFA), from a cardiac standpoint this patient is stable, there is no documented history of CAD.    He does have prostate cancer, the PSA is on the rise, patient did complete radiation therapy.    Plan:    1.  Continue all current medications  2.  From a cardiac standpoint if surgical intervention is indicated for the prostate cancer, patient is low risk to undergo general anesthesia and surgical intervention, there are no contraindications.  All his cardiovascular issues are currently stable and asymptomatic.  3.  Follow-up office visit in 6 months         Current Medications (including today's revisions)  ??? aspirin EC 81 mg tablet Take 81 mg by mouth daily.   ??? atorvastatin (LIPITOR) 20 mg tablet TAKE 1 TABLET BY MOUTH ONCE DAILY   ??? ciprofloxacin (CIPRO) 500 mg tablet Take one tablet by mouth twice daily.   ??? digoxin (LANOXIN) 125 mcg (0.125 mg) tablet Take one tablet by mouth daily. Please make pt aware of change ??? flecainide (TAMBOCOR) 100 mg tablet TAKE 1 TABLET BY MOUTH THREE TIMES DAILY   ??? losartan (COZAAR) 100 mg tablet TAKE 1 TABLET BY MOUTH ONCE DAILY   ??? verapamil CR (CALAN-SR) 120 mg tablet Take 1 tablet by mouth twice daily

## 2018-09-14 ENCOUNTER — Encounter: Admit: 2018-09-14 | Discharge: 2018-09-14

## 2018-09-14 DIAGNOSIS — I4891 Unspecified atrial fibrillation: Secondary | ICD-10-CM

## 2018-09-14 DIAGNOSIS — I4892 Unspecified atrial flutter: Secondary | ICD-10-CM

## 2018-09-14 NOTE — Telephone Encounter
09/14/2018 11:02 AM Patient had OV with Dr. Virgina Organ on 09/13/2018.  She requests patient obtain a Digoxin Level. Called patient, who was agreeable to completing this lab at [facility] next week.  Will fax lab order.

## 2018-09-24 ENCOUNTER — Ambulatory Visit: Admit: 2018-09-09 | Discharge: 2018-09-24

## 2018-09-24 DIAGNOSIS — C61 Malignant neoplasm of prostate: Secondary | ICD-10-CM

## 2018-10-04 LAB — DIGOXIN LEVEL

## 2018-10-05 ENCOUNTER — Encounter: Admit: 2018-10-05 | Discharge: 2018-10-05

## 2018-10-05 DIAGNOSIS — I4892 Unspecified atrial flutter: Secondary | ICD-10-CM

## 2018-10-05 DIAGNOSIS — I4891 Unspecified atrial fibrillation: Secondary | ICD-10-CM

## 2018-11-12 ENCOUNTER — Encounter: Admit: 2018-11-12 | Discharge: 2018-11-12 | Payer: MEDICARE

## 2018-11-22 ENCOUNTER — Encounter: Admit: 2018-11-22 | Discharge: 2018-11-22 | Payer: MEDICARE

## 2018-11-22 DIAGNOSIS — C61 Malignant neoplasm of prostate: Secondary | ICD-10-CM

## 2018-11-22 DIAGNOSIS — M549 Dorsalgia, unspecified: Secondary | ICD-10-CM

## 2018-11-22 DIAGNOSIS — I10 Essential (primary) hypertension: Secondary | ICD-10-CM

## 2018-11-22 DIAGNOSIS — M1990 Unspecified osteoarthritis, unspecified site: Secondary | ICD-10-CM

## 2018-11-22 DIAGNOSIS — I4891 Unspecified atrial fibrillation: Secondary | ICD-10-CM

## 2018-11-22 NOTE — Progress Notes
Name: Zachary Manning          MRN: [id number]      DOB: 1950-10-17      AGE: 68 y.o.   DATE OF SERVICE: 11/22/2018    Subjective:             Reason for Visit:  Prostate Cancer      Zachary Manning is a 68 y.o. male.     Cancer Staging  Prostate cancer [facility])  Staging form: Prostate, AJCC 8th Edition  - Clinical: Stage IIC (cT1c, cN0, cM0, PSA: 5.9, Grade Group: 3) - Unsigned      History of Present Illness  Zachary Manning is a 68 year old man with T1cN0M0 GS7 (4+3), PSA 5.9, GG2 prostate cancer (GS 4+3 cancer in a single core from biopsy) s/p SBRT to the prostate with short term ADT in August of 2019. After completion of therapy, he now has rising PSA up to almost 2. ?He has had imaging and a bone biopsy all of which did not reveal evidence of active disease and his PSA has subsequently declined to 1.8.  He continues to have ejaculatory function and is concerned by this.  Also he has had some urgency of urination with hesitancy and intermittently takes Flomax for this.          Review of Systems   Constitutional: Negative.    HENT: Negative.    Eyes: Negative.    Respiratory: Negative.    Cardiovascular: Negative.    Gastrointestinal: Negative.    Endocrine: Negative.    Genitourinary: Positive for difficulty urinating and frequency.   Musculoskeletal: Negative.    Skin: Negative.    Allergic/Immunologic: Negative.    Neurological: Negative.    Hematological: Negative.    Psychiatric/Behavioral: Negative.          Objective:         ? aspirin EC 81 mg tablet Take 81 mg by mouth daily.   ? atorvastatin (LIPITOR) 20 mg tablet Take one tablet by mouth daily.   ? ciprofloxacin (CIPRO) 500 mg tablet Take one tablet by mouth twice daily.   ? digoxin (LANOXIN) 125 mcg (0.125 mg) tablet Take one tablet by mouth daily.   ? flecainide (TAMBOCOR) 100 mg tablet Take one tablet by mouth three times daily.   ? losartan (COZAAR) 100 mg tablet Take one tablet by mouth daily. ? verapamil CR (CALAN-SR) 120 mg tablet Take one tablet by mouth twice daily.     Vitals:    11/22/18 0823   BP: 132/65   BP Source: Arm, Right Upper   Patient Position: Sitting   Pulse: 59   Resp: 16   Temp: 36.4 ?C (97.5 ?F)   TempSrc: Temporal   SpO2: 100%   Weight: 93.7 kg (206 lb 9.6 oz)   Height: 195.6 cm (77)   PainSc: Zero     Body mass index is 24.5 kg/m?Marland Kitchen     Pain Score: Zero       Fatigue Scale: 0-None    Pain Addressed:  N/A    Patient Evaluated for a Clinical Trial: Patient not eligible for a treatment trial (including not needing treatment, needs palliative care, in remission).     [place] Cooperative Oncology Group performance status is 0, Fully active, able to carry on all pre-disease performance without restriction.Marland Kitchen     Physical Exam  Vitals signs reviewed.   Constitutional:       General: He is not in acute distress.  Appearance: Normal appearance. He is well-developed.   HENT:      Head: Normocephalic and atraumatic.      Mouth/Throat:      Mouth: Mucous membranes are moist.      Pharynx: No posterior oropharyngeal erythema.   Eyes:      General: No scleral icterus.     Extraocular Movements: Extraocular movements intact.   Pulmonary:      Effort: Pulmonary effort is normal. No respiratory distress.   Musculoskeletal: Normal range of motion.         General: No swelling or deformity.   Skin:     General: Skin is warm and dry.      Coloration: Skin is not pale.   Neurological:      General: No focal deficit present.      Mental Status: He is alert and oriented to person, place, and time.   Psychiatric:         Mood and Affect: Mood normal.         Behavior: Behavior normal.         Thought Content: Thought content normal.         Judgment: Judgment normal.               Assessment and Plan:     Problem   Prostate Cancer (Hcc)      PSA history (up to last 5 years):   11/26/15  PSA  5.7 ng/mL  Free PSA:  0.95   %Free PSA:  16.7% free (per note)  08/23/16  PSA  5.88 ng/mL  Lab Results Component Value Date/Time    PSA 1.94 07/18/2018 03:13 PM    PSA 0.74 04/26/2018 02:15 PM    PSA 0.67 01/05/2018 10:26 AM    PSA 0.20 10/05/2017 02:10 PM    PSA 4.86 (H) 07/14/2017 01:27 PM       09/28/16 -- Initial visit with Dr. Jimmey Ralph. DRE right firm nodule 8mm. Plan for template biopsy.    10/05/16  Final Diagnosis:     A. Prostate, left medial apex, biopsy:   Benign prostatic glands and stroma.   Negative for high grade PIN or malignancy. ?     B. Prostate, left medial mid, biopsy:   Benign prostatic glands and stroma.   Negative for high grade PIN or malignancy. ?     C. Prostate, left medial base, biopsy:   Benign prostatic glands and stroma.   Negative for high grade PIN or malignancy. ?     D. Prostate, left lateral apex, biopsy:   Benign prostatic glands and stroma.   Negative for high grade PIN or malignancy. ? ?     E. Prostate, left lateral mid, biopsy:   Benign prostatic glands and stroma.   Negative for high grade PIN or malignancy. ? ?     F. Prostate, left lateral base, biopsy:   Benign prostatic glands and stroma.   Negative for high grade PIN or malignancy. ? ?     G. Prostate, right medial apex, biopsy:   Benign prostatic glands and stroma.   Negative for high grade PIN or malignancy. ?     H. Prostate, right medial mi, biopsy:   Prostatic adenocarcinoma Gleason grade 3+3=score of 6 (Grade group 1) in   1 of one core involving ?   10% of the needle core tissue and measuring 2 mm in length. See comment     I. Prostate, right medial base, biopsy:  Benign prostatic glands and stroma.   Negative for high grade PIN or malignancy. ? ?     J. Prostate, right lateral apex, biopsy:   Benign prostatic glands and stroma.   Negative for high grade PIN or malignancy. ?     K. Prostate, right lateral mid, biopsy:   Benign prostatic glands and stroma.   Negative for high grade PIN or malignancy. ? ?     L. Prostate, right lateral base, biopsy:   Benign prostatic glands and stroma. Negative for high grade PIN or malignancy.     Mri of prostate:  IMPRESSION    1. Lenticular, moderately T2 hypointense lesion within the anterior   transition zone, mid gland to base, which may represent asymmetric   anterior fibromuscular stroma, though meets the T2 imaging criteria for a   PI-RADS 5 abnormality. Associated broad-based abutment and mild bulging of   the anterior prostate capsule without gross extraprostatic extension. This   lesion was contoured in DynaCAD for potential fusion biopsy.     05/11/2017: Fusion biopsy: 1of 13 cores positive.    Prostate, right medial mid, core biopsy: ?   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     Lupron injection: 06/16/2017    SBRT with Dr. Flonnie Hailstone, 08/21/2017 through 08/31/2017             Prostate cancer [facility])  I had the pleasure of visiting with Zachary Manning in clinic today.  I reviewed that his PSA trajectory is reassuring and I recommended continued PSA surveillance.  He would like one today.  We will call him and schedule for a repeat in 4 months.  Otherwise I recommended that he continue his Flomax as needed for his urinary symptoms.      Plan:  1.  PSA today  2.  Return in 4 months with a PSA    Myna Hidalgo, MD  Urologic Oncology  Department of Urology

## 2018-11-22 NOTE — Assessment & Plan Note
I had the pleasure of visiting with Zachary Manning in clinic today.  I reviewed that his PSA trajectory is reassuring and I recommended continued PSA surveillance.  He would like one today.  We will call him and schedule for a repeat in 4 months.  Otherwise I recommended that he continue his Flomax as needed for his urinary symptoms.      Plan:  1.  PSA today  2.  Return in 4 months with a PSA

## 2019-03-14 ENCOUNTER — Encounter: Admit: 2019-03-14 | Discharge: 2019-03-14 | Payer: MEDICARE

## 2019-03-14 ENCOUNTER — Ambulatory Visit: Admit: 2019-03-14 | Discharge: 2019-03-14 | Payer: MEDICARE

## 2019-03-14 DIAGNOSIS — I10 Essential (primary) hypertension: Secondary | ICD-10-CM

## 2019-03-14 DIAGNOSIS — C61 Malignant neoplasm of prostate: Secondary | ICD-10-CM

## 2019-03-14 DIAGNOSIS — M1990 Unspecified osteoarthritis, unspecified site: Secondary | ICD-10-CM

## 2019-03-14 DIAGNOSIS — I4891 Unspecified atrial fibrillation: Secondary | ICD-10-CM

## 2019-03-14 DIAGNOSIS — M549 Dorsalgia, unspecified: Secondary | ICD-10-CM

## 2019-03-15 ENCOUNTER — Encounter: Admit: 2019-03-15 | Discharge: 2019-03-15 | Payer: MEDICARE

## 2019-07-04 ENCOUNTER — Encounter: Admit: 2019-07-04 | Discharge: 2019-07-04 | Payer: MEDICARE

## 2019-07-04 DIAGNOSIS — M1990 Unspecified osteoarthritis, unspecified site: Secondary | ICD-10-CM

## 2019-07-04 DIAGNOSIS — I4891 Unspecified atrial fibrillation: Secondary | ICD-10-CM

## 2019-07-04 DIAGNOSIS — R002 Palpitations: Secondary | ICD-10-CM

## 2019-07-04 DIAGNOSIS — C61 Malignant neoplasm of prostate: Secondary | ICD-10-CM

## 2019-07-04 DIAGNOSIS — R001 Bradycardia, unspecified: Secondary | ICD-10-CM

## 2019-07-04 DIAGNOSIS — M549 Dorsalgia, unspecified: Secondary | ICD-10-CM

## 2019-07-04 DIAGNOSIS — I4892 Unspecified atrial flutter: Secondary | ICD-10-CM

## 2019-07-04 DIAGNOSIS — I10 Essential (primary) hypertension: Secondary | ICD-10-CM

## 2019-07-04 DIAGNOSIS — R5382 Chronic fatigue, unspecified: Secondary | ICD-10-CM

## 2019-07-04 DIAGNOSIS — I480 Paroxysmal atrial fibrillation: Secondary | ICD-10-CM

## 2019-07-04 MED ORDER — FLECAINIDE 100 MG PO TAB
100 mg | ORAL_TABLET | Freq: Three times a day (TID) | ORAL | 3 refills | 30.00000 days | Status: AC
Start: 2019-07-04 — End: ?

## 2019-07-04 MED ORDER — ATORVASTATIN 20 MG PO TAB
20 mg | ORAL_TABLET | Freq: Every day | ORAL | 3 refills | Status: AC
Start: 2019-07-04 — End: ?

## 2019-07-04 MED ORDER — VERAPAMIL 120 MG PO TBER
120 mg | ORAL_TABLET | Freq: Two times a day (BID) | ORAL | 3 refills | 90.00000 days | Status: AC
Start: 2019-07-04 — End: ?

## 2019-07-04 MED ORDER — DIGOXIN 125 MCG (0.125 MG) PO TAB
125 ug | ORAL_TABLET | Freq: Every day | ORAL | 3 refills | 30.00000 days | Status: AC
Start: 2019-07-04 — End: ?

## 2019-07-04 MED ORDER — LOSARTAN 100 MG PO TAB
100 mg | ORAL_TABLET | Freq: Every day | ORAL | 3 refills | 30.00000 days | Status: AC
Start: 2019-07-04 — End: ?

## 2019-07-17 ENCOUNTER — Encounter: Admit: 2019-07-17 | Discharge: 2019-07-17 | Payer: MEDICARE

## 2019-07-17 NOTE — Telephone Encounter
Left message for patient that PSA lab order is in- if possible, he should get it done today, at one of the [facility] lab locations. If unable, we can get it at his appointment tomorrow. Provided callback number for questions or concerns.

## 2019-07-17 NOTE — Telephone Encounter
Returned patient call had to LVM. He called nurse line inquiring whether or not he needs another PSA test? I told the patient in a voice message that I would forward to Dr. Rhys Martini nurse and she would address this for him.

## 2019-07-18 ENCOUNTER — Encounter: Admit: 2019-07-18 | Discharge: 2019-07-18 | Payer: MEDICARE

## 2019-07-19 ENCOUNTER — Encounter: Admit: 2019-07-19 | Discharge: 2019-07-19 | Payer: MEDICARE

## 2019-07-19 NOTE — Telephone Encounter
Returned patient call so we could clarify what exact location he could go to get his lab done and I gave him the address, location and directions for [facility]. He was grateful for the instructions and had no further questions.

## 2019-07-26 ENCOUNTER — Encounter: Admit: 2019-07-26 | Discharge: 2019-07-26 | Payer: MEDICARE

## 2019-07-26 ENCOUNTER — Ambulatory Visit: Admit: 2019-07-26 | Discharge: 2019-07-26 | Payer: MEDICARE

## 2019-07-26 DIAGNOSIS — C61 Malignant neoplasm of prostate: Secondary | ICD-10-CM

## 2019-07-26 LAB — PROSTATIC SPECIFIC ANTIGEN-PSA: Lab: 0.6 ng/mL (ref <4.01)

## 2019-07-26 NOTE — Telephone Encounter
-----   Message from Myna Hidalgo, MD sent at 07/26/2019  2:13 PM CDT -----  Can yall let him know his PSA?  He needs a return in 6 months with a PSA unless he is having problems.  ----- Message -----  From: Leory Plowman, In Results Misys  Sent: 07/26/2019   1:26 PM CDT  To: Myna Hidalgo, MD

## 2019-07-31 ENCOUNTER — Encounter: Admit: 2019-07-31 | Discharge: 2019-07-31 | Payer: MEDICARE

## 2019-07-31 NOTE — Telephone Encounter
[facility] nurse line received VM from patient. Patient follows Dr Jimmey Ralph. Forwarded VM to Dr Lavone Neri nurse, Nehemiah Massed

## 2019-09-17 ENCOUNTER — Encounter: Admit: 2019-09-17 | Discharge: 2019-09-17 | Payer: MEDICARE

## 2019-09-17 MED ORDER — DIGOXIN 125 MCG (0.125 MG) PO TAB
ORAL_TABLET | Freq: Every day | 0 refills
Start: 2019-09-17 — End: ?

## 2019-10-28 ENCOUNTER — Encounter: Admit: 2019-10-28 | Discharge: 2019-10-28 | Payer: MEDICARE

## 2019-10-29 ENCOUNTER — Encounter: Admit: 2019-10-29 | Discharge: 2019-10-29 | Payer: MEDICARE

## 2019-10-29 DIAGNOSIS — R319 Hematuria, unspecified: Secondary | ICD-10-CM

## 2019-10-29 NOTE — Telephone Encounter
Called and spoke with patient for >20 mins.  Relayed appts for CT and cysto on 10/21. We reviewed instructions and what to expect.    We discussed his recent episode of hematuria and he reports his urine is now clear. I advised patient that despite this, he still needs to complete workup. Patient given strict return/call precautions including large clots, unable to void, incomplete bladder emptying, dysuria, fever, any other concerning changes. He is aware that hematuria may recur, and he should push fluids and decrease activity for even pink or light red tinged urine to prevent worsening.    He voiced understanding of all discussed, no other questions at this time.

## 2019-10-29 NOTE — Telephone Encounter
He developed gross blood in the urine today. This has now happened on two occasions today. He had passage of 2 blood clots in the urine this evening as well, but he is able to empty his bladder to comfort. He had some straining with passage of clots. He has never had this happen before.    He also notes burning of the urethra with ejaculation, which occasionally happens.    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    I encouraged him to drink plenty of water (3-4 liters daily) to reduce the risk of clot formation. He has never been told to fluid restrict for medical reasons.    If he is unable to urinate, has dizziness, lightheadedness, or passes out, then he should go to the ER.    Colon Flattery, MD  Urology Resident  Please page urology on call

## 2019-11-14 ENCOUNTER — Encounter: Admit: 2019-11-14 | Discharge: 2019-11-14 | Payer: MEDICARE

## 2019-11-14 ENCOUNTER — Ambulatory Visit: Admit: 2019-11-14 | Discharge: 2019-11-14 | Payer: MEDICARE

## 2019-11-14 DIAGNOSIS — I4891 Unspecified atrial fibrillation: Secondary | ICD-10-CM

## 2019-11-14 DIAGNOSIS — I10 Essential (primary) hypertension: Secondary | ICD-10-CM

## 2019-11-14 DIAGNOSIS — M1990 Unspecified osteoarthritis, unspecified site: Secondary | ICD-10-CM

## 2019-11-14 DIAGNOSIS — M549 Dorsalgia, unspecified: Secondary | ICD-10-CM

## 2019-11-14 DIAGNOSIS — R319 Hematuria, unspecified: Secondary | ICD-10-CM

## 2019-11-14 LAB — POC CREATININE, RAD: Lab: 0.9 mg/dL (ref 0.4–1.24)

## 2019-11-14 MED ORDER — LIDOCAINE HCL 2 % MM JELP
Freq: Once | TOPICAL | 0 refills | Status: CP
Start: 2019-11-14 — End: ?
  Administered 2019-11-14: 18:00:00 20.000 mL via TOPICAL

## 2019-11-14 MED ORDER — IOHEXOL 350 MG IODINE/ML IV SOLN
100 mL | Freq: Once | INTRAVENOUS | 0 refills | Status: CP
Start: 2019-11-14 — End: ?

## 2019-11-14 MED ORDER — SODIUM CHLORIDE 0.9 % IJ SOLN
50 mL | Freq: Once | INTRAVENOUS | 0 refills | Status: CP
Start: 2019-11-14 — End: ?
  Administered 2019-11-14: 16:00:00 50 mL via INTRAVENOUS

## 2019-11-14 MED ORDER — WATER FOR INJECTION, STERILE IV SOLP
1000 mL | Freq: Once | 0 refills | Status: CP
Start: 2019-11-14 — End: ?
  Administered 2019-11-14: 18:00:00 1000 mL

## 2019-11-14 NOTE — Procedures
Procedure:  Cystourethroscopy    Provider: Myna Hidalgo, MD    Anesthesia:  2% Lidocaine Gel, Intraurethral  Complications:  None    Indication for Procedure:      69 y.o. male w/ gross hematuria    After informed consent obtained, the patient was placed in dorsolithotomy position then prepped in the usual fashion.  Local 2% lidocaine gel placed per urethra.  The flexible cystoscope was gently passed into the urethra under direct visualization.  Urethra: No strictures or lesions.  Bladder Neck: Normal.  Trabeculation: None.  Bilateral orthotopic UO w/ clear efflux of urine.  Retroflex View performed.  No papillary tumors.  No stones or debris.  Glomerulations on the bladder neck.  Subsequently the cystoscope was withdrawn.  The patient tolerated the procedure well, and left the exam room in a pleasant disposition.      Assessment/ Plan:    1.  Hematuria  - Cysto today revealed no evidence of bladder tumor  - RTC as scheduled      Orders Placed This Encounter   ? CYSTOSCOPY       Myna Hidalgo, MD

## 2019-11-14 NOTE — Progress Notes
Name: Zachary Manning          MRN: [id number]      DOB: 09-26-1950      AGE: 69 y.o.   DATE OF SERVICE: 11/14/2019    Subjective:             Reason for Visit:  Prostate Cancer      Zachary Manning is a 69 y.o. male.     Cancer Staging  Prostate cancer [facility])  Staging form: Prostate, AJCC 8th Edition  - Clinical: Stage IIC (cT1c, cN0, cM0, PSA: 5.9, Grade Group: 3) - Unsigned      History of Present Illness  Zachary Manning is a 69 year old man with a history of prostate cancer treated with SBRT to the prostate in August 2019.  More recently had gross hematuria on 10/28/2019 (resolved) and presents for a hematuria evaluation.  He currently denies any urinary symptoms or complaints.         Review of Systems   Constitutional: Negative.    HENT: Negative.    Eyes: Negative.    Respiratory: Negative.    Cardiovascular: Negative.    Gastrointestinal: Positive for constipation.   Endocrine: Positive for polyuria.   Genitourinary: Positive for hematuria.   Musculoskeletal: Positive for back pain, neck pain and neck stiffness.   Skin: Negative.    Allergic/Immunologic: Negative.    Neurological: Negative.    Hematological: Negative.    Psychiatric/Behavioral: Negative.          Objective:         ? aspirin EC 81 mg tablet Take 81 mg by mouth daily.   ? atorvastatin (LIPITOR) 20 mg tablet Take one tablet by mouth daily.   ? digoxin (LANOXIN) 125 mcg (0.125 mg) tablet Take one tablet by mouth daily.   ? flecainide (TAMBOCOR) 100 mg tablet Take one tablet by mouth three times daily.   ? LINZESS 145 mcg capsule Take 145 mcg by mouth daily.   ? losartan (COZAAR) 100 mg tablet Take one tablet by mouth daily.   ? verapamil CR (CALAN-SR) 120 mg tablet Take one tablet by mouth twice daily.     Vitals:    11/14/19 1202   BP: (!) 145/60   BP Source: Arm, Right Upper   Patient Position: Sitting   Pulse: 52   Resp: 16   Temp: 36.2 ?C (97.2 ?F)   TempSrc: Temporal   SpO2: 99%   Weight: 90.1 kg (198 lb 9.6 oz)   Height: 198.1 cm (78) PainSc: Zero     Body mass index is 22.95 kg/m?Marland Kitchen     Pain Score: Zero       Fatigue Scale: 0-None    Pain Addressed:  N/A    Patient Evaluated for a Clinical Trial: Patient not eligible for a treatment trial (including not needing treatment, needs palliative care, in remission).     [place] Cooperative Oncology Group performance status is 0, Fully active, able to carry on all pre-disease performance without restriction.Marland Kitchen     Physical Exam          Assessment and Plan:  Prostate cancer, hematuria  -  Recommended a cystoscopy to complete his hematuria evaluation and he was in agreement.    -  Call with CT results when available.

## 2019-11-14 NOTE — Patient Instructions
Cystoscopy    Cystoscopy is a procedure that lets your healthcare provider look directly inside your urethra and bladder. It can be used to:   · Help diagnose a problem with your urethra, bladder, or kidneys.  · Take a sample (biopsy) of bladder or urethral tissue.  · Treat certain problems (such as removing kidney stones).  · Place a tube (stent) to bypass a blockage.  · Take special X-rays of the kidneys.  Based on the findings, your provider may advise other tests or treatments.   What is a cystoscope?  A cystoscope is a telescope-like tube that contains lenses and small glass wires that make bright light (fiberoptics). The cystoscope may be straight and rigid. Or it may be flexible to bend around curves in the urethra. The healthcare provider may look directly into the cystoscope, or project the image onto a screen.   Getting ready  · Ask your healthcare provider if you should stop taking any medicines before the procedure.  · Follow any directions you are given for not eating or drinking before the procedure.  · Follow any other instructions your healthcare provider gives you.    Tell your healthcare provider before the exam if you:   · Take any medicines, such as aspirin or blood thinners. This also includes any over-the-counter and prescription medicines, vitamins, herbs, and other supplements.  · Have allergies to any medicines  · Are pregnant or think you may be pregnant    During the procedure  Cystoscopy is done in the healthcare provider’s office, surgery center, or hospital. The doctor and a nurse are present during the procedure. It takes only a few minutes. It takes longer if a biopsy, X-ray, or treatment needs to be done.   During the procedure:  · You lie on an exam table on your back, knees bent and legs apart. You are covered with a drape.  · Your urethra and the area around it are washed. Anesthetic jelly may be applied to numb the urethra. Other pain medicine is often not needed. In some  cases, you may be offered a mild sedative to help you relax. If a more extensive procedure is to be done, such as a biopsy or kidney stone removal, general anesthesia may be needed. Often a one-time antibiotic is given.  · The cystoscope is inserted. A sterile fluid is put into the bladder to expand it. You may feel pressure from this fluid.  · When the procedure is done, the cystoscope is removed.  After the procedure  If you had a sedative, general anesthesia, or spinal anesthesia, you must have someone drive you home. Once you’re home:   · Drink plenty of fluids.  · You may have burning or light bleeding when you urinate. This is normal.  · Medicines may be prescribed to ease any mild pain or prevent infection. Take these as directed.  · Call your healthcare provider if you have heavy bleeding or blood clots, burning that lasts more than a day, a fever over  100° F  ( 38°C ), or trouble urinating.  [employer] last reviewed this educational content on 10/24/2017  © 2000-2021 [employer]. All rights reserved. This information is not intended as a substitute for professional medical care. Always follow your healthcare professional's instructions.

## 2019-11-15 ENCOUNTER — Encounter: Admit: 2019-11-15 | Discharge: 2019-11-15 | Payer: MEDICARE

## 2019-11-18 ENCOUNTER — Encounter: Admit: 2019-11-18 | Discharge: 2019-11-18 | Payer: MEDICARE

## 2019-11-18 NOTE — Telephone Encounter
I reviewed the CT results.  All questions answered.

## 2020-02-06 ENCOUNTER — Encounter: Admit: 2020-02-06 | Discharge: 2020-02-06 | Payer: MEDICARE

## 2020-02-06 DIAGNOSIS — I10 Essential (primary) hypertension: Secondary | ICD-10-CM

## 2020-02-06 DIAGNOSIS — I4891 Unspecified atrial fibrillation: Secondary | ICD-10-CM

## 2020-02-06 DIAGNOSIS — R002 Palpitations: Secondary | ICD-10-CM

## 2020-02-06 DIAGNOSIS — R001 Bradycardia, unspecified: Secondary | ICD-10-CM

## 2020-02-06 DIAGNOSIS — M1990 Unspecified osteoarthritis, unspecified site: Secondary | ICD-10-CM

## 2020-02-06 DIAGNOSIS — I4892 Unspecified atrial flutter: Secondary | ICD-10-CM

## 2020-02-06 DIAGNOSIS — M549 Dorsalgia, unspecified: Secondary | ICD-10-CM

## 2020-02-06 NOTE — Progress Notes
Date of Service: 02/06/2020    Cortney Mccorquodale is a 70 y.o. male.       HPI     Patient is a 70 year old white male, he was last seen in the office in June 2021.    He does have a history of atrial fibrillation, CTI dependent atrial flutter, this was previously noted on an EKG performed in December 2014. Patient was treated conservatively, he never underwent radiofrequency ablation.    His heart rate has been controlled with a combination of digoxin and verapamil, in addition he has been on flecainide for rhythm control. Patient does not have any documented history of coronary artery disease. He was evaluated with a perfusion imaging study in February 2020, the ejection fraction was normal and the tomographic pattern was negative for ischemia.    Patient currently does not have any symptoms of chest pain, no heart palpitations, no lightheadedness, no dizziness and no presyncope or syncope.    The echocardiogram that was performed in February 2020 demonstrated normal left ventricular systolic function, aortic valve was sclerotic but not stenotic and overall there are no other significant abnormalities.         Vitals:    02/06/20 1547   BP: (!) 148/72   BP Source: Arm, Left Upper   Patient Position: Sitting   Pulse: 64   SpO2: 98%   Weight: 94.3 kg (208 lb)   Height: 1.981 m (6' 6)   PainSc: Zero     Body mass index is 24.04 kg/m?Marland Kitchen     Past Medical History  Patient Active Problem List    Diagnosis Date Noted   ? Prostate cancer (HCC) 09/28/2016       PSA history (up to last 5 years):   11/26/15  PSA  5.7 ng/mL  Free PSA:  0.95   %Free PSA:  16.7% free (per note)  08/23/16  PSA  5.88 ng/mL  Lab Results   Component Value Date/Time    PSA 1.94 07/18/2018 03:13 PM    PSA 0.74 04/26/2018 02:15 PM    PSA 0.67 01/05/2018 10:26 AM    PSA 0.20 10/05/2017 02:10 PM    PSA 4.86 (H) 07/14/2017 01:27 PM       09/28/16 -- Initial visit with Dr. Jimmey Ralph. DRE right firm nodule 8mm. Plan for template biopsy.    10/05/16  Final Diagnosis:     A. Prostate, left medial apex, biopsy:   Benign prostatic glands and stroma.   Negative for high grade PIN or malignancy. ?     B. Prostate, left medial mid, biopsy:   Benign prostatic glands and stroma.   Negative for high grade PIN or malignancy. ?     C. Prostate, left medial base, biopsy:   Benign prostatic glands and stroma.   Negative for high grade PIN or malignancy. ?     D. Prostate, left lateral apex, biopsy:   Benign prostatic glands and stroma.   Negative for high grade PIN or malignancy. ? ?     E. Prostate, left lateral mid, biopsy:   Benign prostatic glands and stroma.   Negative for high grade PIN or malignancy. ? ?     F. Prostate, left lateral base, biopsy:   Benign prostatic glands and stroma.   Negative for high grade PIN or malignancy. ? ?     G. Prostate, right medial apex, biopsy:   Benign prostatic glands and stroma.   Negative for high grade PIN or malignancy. ?  H. Prostate, right medial mi, biopsy:   Prostatic adenocarcinoma Gleason grade 3+3=score of 6 (Grade group 1) in   1 of one core involving ?   10% of the needle core tissue and measuring 2 mm in length. See comment     I. Prostate, right medial base, biopsy:   Benign prostatic glands and stroma.   Negative for high grade PIN or malignancy. ? ?     J. Prostate, right lateral apex, biopsy:   Benign prostatic glands and stroma.   Negative for high grade PIN or malignancy. ?     K. Prostate, right lateral mid, biopsy:   Benign prostatic glands and stroma.   Negative for high grade PIN or malignancy. ? ?     L. Prostate, right lateral base, biopsy:   Benign prostatic glands and stroma.   Negative for high grade PIN or malignancy.     Mri of prostate:  IMPRESSION    1. Lenticular, moderately T2 hypointense lesion within the anterior   transition zone, mid gland to base, which may represent asymmetric   anterior fibromuscular stroma, though meets the T2 imaging criteria for a   PI-RADS 5 abnormality. Associated broad-based abutment and mild bulging of   the anterior prostate capsule without gross extraprostatic extension. This   lesion was contoured in DynaCAD for potential fusion biopsy.     05/11/2017: Fusion biopsy: 1of 13 cores positive.    Prostate, right medial mid, core biopsy: ?   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     Lupron injection: 06/16/2017    SBRT with Dr. Flonnie Hailstone, 08/21/2017 through 08/31/2017         ? Atrial flutter (HCC) 02/11/2014   ? Poor dentition 10/11/2012   ? Heart palpitations 10/11/2012   ? Atrial fibrillation (HCC) 02/02/2011   ? Hypertension 02/02/2011   ? Bradycardia 02/02/2011   ? ETOH abuse 02/02/2011   ? Fatigue 02/02/2011         Review of Systems   Constitutional: Negative.   HENT: Negative.    Eyes: Negative.    Cardiovascular: Negative.    Respiratory: Negative.    Endocrine: Negative.    Hematologic/Lymphatic: Negative.    Skin: Negative.    Musculoskeletal: Negative.    Gastrointestinal: Negative.    Genitourinary: Negative.    Neurological: Negative.    Psychiatric/Behavioral: Negative.    Allergic/Immunologic: Negative.        Physical Exam  General Appearance: normal in appearance  Skin: warm, moist, no ulcers or xanthomas  Eyes: conjunctivae and lids normal, pupils are equal and round  Lips & Oral Mucosa: no pallor or cyanosis  Neck Veins: neck veins are flat, neck veins are not distended  Chest Inspection: chest is normal in appearance  Respiratory Effort: breathing comfortably, no respiratory distress  Auscultation/Percussion: lungs clear to auscultation, no rales or rhonchi, no wheezing  Cardiac Rhythm: regular rhythm and normal rate  Cardiac Auscultation: S1, S2 normal, no rub, no gallop  Murmurs: no murmur  Carotid Arteries: normal carotid upstroke bilaterally, no bruit  Lower Extremity Edema: no lower extremity edema  Abdominal Exam: soft, non-tender, no masses, bowel sounds normal  Liver & Spleen: no organomegaly  Language and Memory: patient responsive and seems to comprehend information  Neurologic Exam: neurological assessment grossly intact      Cardiovascular Studies  Twelve-lead EKG demonstrates normal sinus rhythm, no  ST segment T wave changes, normal PR and QT intervals, no axis deviation    Cardiovascular Health Factors  Vitals BP Readings from Last 3 Encounters:   02/06/20 (!) 148/72   11/14/19 (!) 145/60   07/04/19 124/70     Wt Readings from Last 3 Encounters:   02/06/20 94.3 kg (208 lb)   11/14/19 90.1 kg (198 lb 9.6 oz)   07/04/19 91.1 kg (200 lb 12.8 oz)     BMI Readings from Last 3 Encounters:   02/06/20 24.04 kg/m?   11/14/19 22.95 kg/m?   07/04/19 23.20 kg/m?      Smoking Social History     Tobacco Use   Smoking Status Former Smoker   ? Quit date: 2011   ? Years since quitting: 11.0   Smokeless Tobacco Current User   ? Types: Chew      Lipid Profile Cholesterol   Date Value Ref Range Status   01/09/2018 120 (L) 150 - 200 Final     HDL   Date Value Ref Range Status   01/09/2018 38  Final     LDL   Date Value Ref Range Status   01/09/2018 67  Final     Triglycerides   Date Value Ref Range Status   01/09/2018 162  Final      Blood Sugar No results found for: HGBA1C  Glucose   Date Value Ref Range Status   03/11/2018 100  Final   01/09/2018 107  Final   01/23/2017 93  Final          Problems Addressed Today  Encounter Diagnoses   Name Primary?   ? Atrial fibrillation, unspecified type (HCC) Yes   ? Atrial flutter, unspecified type (HCC)    ? Primary hypertension    ? Bradycardia    ? Heart palpitations        Assessment and Plan     In summary: This is a 70 year old white male with the following cardiovascular issues:  ?  1.  History of atrial fibrillation/atrial flutter?patient was found to have CTI dependent atrial flutter on an EKG dated 01/22/2013, he did not undergo RFA, this was managed conservatively, and patient maintained sinus mechanism for many years  2.  No documented history of coronary artery disease?patient was evaluated with a perfusion imaging study in February 2020  3.  No evidence of structural heart disease?patient is known to have normal LV systolic function  4.  CHA2DS2-VASc score is 1?intermediate risk, it her risk of stroke is 1.3%, patient has elected to continue aspirin 81 mg p.o. daily  5.  History of prostate cancer?patient did undergo radiation therapy, he is scheduled to undergo to [facility] tomorrow for follow-up and a PSA check    Plan:    1. Continue all current medications, today we refilled all cardiac medications  2. We will see this patient back in the office in approximately 6 months, we can potentially plan on performing another perfusion imaging study or stress echocardiogram in the light of his age, risk factors for CAD and continuation of the class Ic antiarrhythmic drug therapy.         Current Medications (including today's revisions)  ? aspirin EC 81 mg tablet Take 81 mg by mouth daily.   ? atorvastatin (LIPITOR) 20 mg tablet Take one tablet by mouth daily.   ? digoxin (LANOXIN) 125 mcg (0.125 mg) tablet Take one tablet by mouth daily.   ? flecainide (TAMBOCOR) 100 mg tablet Take one  tablet by mouth three times daily.   ? LINZESS 145 mcg capsule Take 145 mcg by mouth daily.   ? losartan (COZAAR) 100 mg tablet Take one tablet by mouth daily.   ? verapamil CR (CALAN-SR) 120 mg tablet Take one tablet by mouth twice daily.

## 2020-02-07 ENCOUNTER — Encounter: Admit: 2020-02-07 | Discharge: 2020-02-07 | Payer: MEDICARE

## 2020-02-07 DIAGNOSIS — M549 Dorsalgia, unspecified: Secondary | ICD-10-CM

## 2020-02-07 DIAGNOSIS — C61 Malignant neoplasm of prostate: Secondary | ICD-10-CM

## 2020-02-07 DIAGNOSIS — I4891 Unspecified atrial fibrillation: Secondary | ICD-10-CM

## 2020-02-07 DIAGNOSIS — I10 Essential (primary) hypertension: Secondary | ICD-10-CM

## 2020-02-07 DIAGNOSIS — M1990 Unspecified osteoarthritis, unspecified site: Secondary | ICD-10-CM

## 2020-02-07 LAB — PROSTATIC SPECIFIC ANTIGEN-PSA: Lab: 0.5 ng/mL (ref <4.01)

## 2020-02-07 NOTE — Progress Notes
Date of Service: 02/07/2020     Subjective:             Zachary Manning is a 70 y.o. male.    Chief Complaint   Patient presents with   ? Follow Up       Cancer Staging  Prostate cancer [facility])  Staging form: Prostate, AJCC 8th Edition  - Clinical: Stage IIC (cT1c, cN0, cM0, PSA: 5.9, Grade Group: 3) - Unsigned      History of Present Illness  [name] is a very pleasant 70 year old male, with T1cN0M0, PSA of 5.9 ng/mL. He had grade group 2 prostate cancer, Gleason score 4+3 equal 7, in a single core biopsy.  He received SBRT to the prostate with short-term ADT in August 2019.  After completion of his therapy, he had a rising PSA up to almost of 2.  He had imaging and bone biopsy, all of which did not reveal any evidence of active disease and his PSA has been subsequently declining.  He has not had a recent PSA, he will have a PSA drawn after clinic visit today.    He had called in and spoke with the resident on 10/28/2019 as he had some gross hematuria, he passed 2 big blood clots.  He did have a cystoscopy on 11/14/2018, it was negative for bladder tumor, he did have a CT scan on 11/14/2019, CT revealed nonobstructing left renal calculus and a left inguinal hernia containing a portion of the urinary bladder.    He denies any urgency, states he urinates 3 times during the day.  He states within the last month he has not had any nocturia.  A couple months ago he would get up once at night.  He does feel that empties his bladder after urination.  He denies hematuria or recent infections.  He describes the stream is a moderate/full stream.  He denies any hesitancy or intermittent break in his stream.    He does have erectile dysfunction, he has not tried any medications or interventions.  He is not bothered by this, he is not currently sexually active.         Review of Systems   Constitutional: Negative for activity change, appetite change, chills, diaphoresis, fatigue, fever and unexpected weight change. HENT: Negative for congestion, hearing loss, mouth sores, rhinorrhea, sinus pressure, sore throat and trouble swallowing.    Eyes: Negative for discharge and visual disturbance.   Respiratory: Negative for apnea, cough, chest tightness, shortness of breath and wheezing.    Cardiovascular: Negative for chest pain, palpitations and leg swelling.   Gastrointestinal: Negative for abdominal pain, blood in stool, constipation, diarrhea, nausea, rectal pain and vomiting.   Genitourinary: Negative for decreased urine volume, difficulty urinating, dysuria, enuresis, flank pain, frequency, genital sores, hematuria, penile discharge, penile pain, penile swelling, scrotal swelling, testicular pain and urgency.   Musculoskeletal: Negative for arthralgias, back pain, gait problem and myalgias.   Skin: Negative for rash and wound.   Neurological: Negative for dizziness, tremors, seizures, syncope, weakness, light-headedness, numbness and headaches.   Hematological: Negative for adenopathy. Does not bruise/bleed easily.   Psychiatric/Behavioral: Negative for agitation, behavioral problems, decreased concentration, dysphoric mood and sleep disturbance. The patient is not nervous/anxious.        Objective:         ? aspirin EC 81 mg tablet Take 81 mg by mouth daily.   ? atorvastatin (LIPITOR) 20 mg tablet Take one tablet by mouth daily.   ?  digoxin (LANOXIN) 125 mcg (0.125 mg) tablet Take one tablet by mouth daily.   ? flecainide (TAMBOCOR) 100 mg tablet Take one tablet by mouth three times daily.   ? LINZESS 145 mcg capsule Take 145 mcg by mouth daily.   ? losartan (COZAAR) 100 mg tablet Take one tablet by mouth daily.   ? verapamil CR (CALAN-SR) 120 mg tablet Take one tablet by mouth twice daily.       Vitals:    02/07/20 0930   BP: (!) 160/74   Pulse: 61   Resp: 16   Temp: 36.5 ?C (97.7 ?F)   TempSrc: Temporal   SpO2: 100%   Weight: 95.3 kg (210 lb)   Height: 198.1 cm (78)       Body mass index is 24.27 kg/m?.     Labs:  [place] PSA Hx:  Lab Results   Component Value Date    PSA 0.64 07/26/2019    PSA 0.56 03/14/2019    PSA 0.64 11/22/2018    PSA 1.80 09/10/2018    PSA 1.94 07/18/2018    PSA 0.74 04/26/2018    PSA 0.67 01/05/2018    PSA 0.20 10/05/2017    PSA 4.86 (H) 07/14/2017       Physical Exam  Vitals reviewed.   Constitutional:       General: He is not in acute distress.     Appearance: Normal appearance. He is not ill-appearing.   HENT:      Head: Normocephalic and atraumatic.   Eyes:      General: No scleral icterus.     Extraocular Movements: Extraocular movements intact.      Conjunctiva/sclera: Conjunctivae normal.   Pulmonary:      Effort: Pulmonary effort is normal. No respiratory distress.   Abdominal:      General: There is no distension.      Palpations: Abdomen is soft.   Musculoskeletal:         General: No swelling. Normal range of motion.      Cervical back: Normal range of motion.   Skin:     General: Skin is warm and dry.   Neurological:      Mental Status: He is alert and oriented to person, place, and time.   Psychiatric:         Mood and Affect: Mood normal.         Behavior: Behavior normal.         Thought Content: Thought content normal.         Judgment: Judgment normal.             Assessment and Plan:  Problem   Prostate Cancer ([facility])      PSA history (up to last 5 years):   11/26/15  PSA  5.7 ng/mL  Free PSA:  0.95   %Free PSA:  16.7% free (per note)  08/23/16  PSA  5.88 ng/mL  Lab Results   Component Value Date/Time    PSA 1.94 07/18/2018 03:13 PM    PSA 0.74 04/26/2018 02:15 PM    PSA 0.67 01/05/2018 10:26 AM    PSA 0.20 10/05/2017 02:10 PM    PSA 4.86 (H) 07/14/2017 01:27 PM       09/28/16 -- Initial visit with Dr. Jimmey Ralph. DRE right firm nodule 8mm. Plan for template biopsy.    10/05/16  Final Diagnosis:     A. Prostate, left medial apex, biopsy:   Benign prostatic glands and stroma.   Negative  for high grade PIN or malignancy. ?     B. Prostate, left medial mid, biopsy:   Benign prostatic glands and stroma. Negative for high grade PIN or malignancy. ?     C. Prostate, left medial base, biopsy:   Benign prostatic glands and stroma.   Negative for high grade PIN or malignancy. ?     D. Prostate, left lateral apex, biopsy:   Benign prostatic glands and stroma.   Negative for high grade PIN or malignancy. ? ?     E. Prostate, left lateral mid, biopsy:   Benign prostatic glands and stroma.   Negative for high grade PIN or malignancy. ? ?     F. Prostate, left lateral base, biopsy:   Benign prostatic glands and stroma.   Negative for high grade PIN or malignancy. ? ?     G. Prostate, right medial apex, biopsy:   Benign prostatic glands and stroma.   Negative for high grade PIN or malignancy. ?     H. Prostate, right medial mi, biopsy:   Prostatic adenocarcinoma Gleason grade 3+3=score of 6 (Grade group 1) in   1 of one core involving ?   10% of the needle core tissue and measuring 2 mm in length. See comment     I. Prostate, right medial base, biopsy:   Benign prostatic glands and stroma.   Negative for high grade PIN or malignancy. ? ?     J. Prostate, right lateral apex, biopsy:   Benign prostatic glands and stroma.   Negative for high grade PIN or malignancy. ?     K. Prostate, right lateral mid, biopsy:   Benign prostatic glands and stroma.   Negative for high grade PIN or malignancy. ? ?     L. Prostate, right lateral base, biopsy:   Benign prostatic glands and stroma.   Negative for high grade PIN or malignancy.     Mri of prostate:  IMPRESSION    1. Lenticular, moderately T2 hypointense lesion within the anterior   transition zone, mid gland to base, which may represent asymmetric   anterior fibromuscular stroma, though meets the T2 imaging criteria for a   PI-RADS 5 abnormality. Associated broad-based abutment and mild bulging of   the anterior prostate capsule without gross extraprostatic extension. This   lesion was contoured in DynaCAD for potential fusion biopsy.     05/11/2017: Fusion biopsy: 1of 13 cores positive.    Prostate, right medial mid, core biopsy: ?   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     Lupron injection: 06/16/2017    SBRT with Dr. Flonnie Hailstone, 08/21/2017 through 08/31/2017             Prostate cancer [facility])  I had the pleasure of visiting with [name] in clinic today.  He has good urinary control, no new urinary complaints.  He does state within the last month he has not had any nocturia, 2 months ago he would get up 1 time.  He does feel that he empties his bladder after urination.  He describes to stream is a moderate/full stream.  He has not had a recent PSA, he will have one drawn after clinic visit today, he will be contacted with results.    He does have erectile dysfunction, he has not tried any medications or interventions.  He is not bothered by this, he currently  is not sexually active.    Plan:  1.  PSA today, will contact with results  2.  Return to clinic in 6 months with PSA, he will see Dr. Jimmey Ralph    Orders Placed This Encounter   ? PROSTATIC SPECIFIC ANTIGEN-PSA   ? PROSTATIC SPECIFIC ANTIGEN-PSA       [place] Jet[place], APRN-NP  Urology

## 2020-02-07 NOTE — Assessment & Plan Note
I had the pleasure of visiting with [name] in clinic today.  He has good urinary control, no new urinary complaints.  He does state within the last month he has not had any nocturia, 2 months ago he would get up 1 time.  He does feel that he empties his bladder after urination.  He describes to stream is a moderate/full stream.  He has not had a recent PSA, he will have one drawn after clinic visit today, he will be contacted with results.    He does have erectile dysfunction, he has not tried any medications or interventions.  He is not bothered by this, he currently is not sexually active.    Plan:  1.  PSA today, will contact with results  2.  Return to clinic in 6 months with PSA, he will see Dr. Jerline Pain

## 2020-02-07 NOTE — Telephone Encounter
I called and spoke with [name] to let him know of his PSA that he had drawn after clinic visit, it returned at 0.51 ng/mL, decreased from previous value and stable.  We will keep the plan and have him see Dr. Jerline Pain in 6 months. He verbalized understanding. He did not have any further questions.       Labs:  [place] PSA Hx:  Lab Results   Component Value Date    PSA 0.51 02/07/2020    PSA 0.64 07/26/2019    PSA 0.56 03/14/2019    PSA 0.64 11/22/2018    PSA 1.80 09/10/2018    PSA 1.94 07/18/2018    PSA 0.74 04/26/2018    PSA 0.67 01/05/2018    PSA 0.20 10/05/2017    PSA 4.86 (H) 07/14/2017

## 2020-06-08 ENCOUNTER — Encounter: Admit: 2020-06-08 | Discharge: 2020-06-08 | Payer: MEDICARE

## 2020-06-08 MED ORDER — LOSARTAN 100 MG PO TAB
100 mg | ORAL_TABLET | Freq: Every day | ORAL | 3 refills | 90.00000 days | Status: AC
Start: 2020-06-08 — End: ?

## 2020-07-11 ENCOUNTER — Encounter: Admit: 2020-07-11 | Discharge: 2020-07-11 | Payer: MEDICARE

## 2020-07-11 MED ORDER — LOSARTAN 100 MG PO TAB
ORAL_TABLET | Freq: Every day | 0 refills
Start: 2020-07-11 — End: ?

## 2020-08-04 ENCOUNTER — Encounter: Admit: 2020-08-04 | Discharge: 2020-08-04 | Payer: MEDICARE

## 2020-08-04 DIAGNOSIS — M1990 Unspecified osteoarthritis, unspecified site: Secondary | ICD-10-CM

## 2020-08-04 DIAGNOSIS — C61 Malignant neoplasm of prostate: Secondary | ICD-10-CM

## 2020-08-04 DIAGNOSIS — I10 Essential (primary) hypertension: Secondary | ICD-10-CM

## 2020-08-04 DIAGNOSIS — M549 Dorsalgia, unspecified: Secondary | ICD-10-CM

## 2020-08-04 DIAGNOSIS — I4891 Unspecified atrial fibrillation: Secondary | ICD-10-CM

## 2020-08-04 LAB — PROSTATIC SPECIFIC ANTIGEN-PSA: PROSTATIC SPEC AG: 0.8 ng/mL (ref <4.01)

## 2020-08-04 NOTE — Progress Notes
Date of Service: 08/04/2020    Subjective:             Zachary Manning is a 70 y.o. male.    History of Present Illness  [name] is a very pleasant 70 year old male, with T1cN0M0, PSA of 5.9 ng/mL. He had grade group 2 prostate cancer, Gleason score 4+3 equal 7, in a single core biopsy.  He received SBRT to the prostate with short-term ADT in August 2019.  After completion of his therapy, he had a rising PSA up to almost of 2.   He underwent a metastatic evaluation which was negative for evidence of recurrent or persistent disease and has been on PSA observation every 6 months.      He also has a history of hematuria and completed a hematuria evaluation in 2021 which was negative.  He has had no recurrence of his hematuria.  He does have (from the CT) a history of a 5mm left renal stone and a left inguinal hernia and he expressly denies renal colic or groin pain.     Review of Systems   Constitutional: Negative.    HENT: Negative.    Eyes: Negative.    Respiratory: Negative.    Cardiovascular: Negative.    Gastrointestinal: Negative.    Endocrine: Negative.    Genitourinary: Negative.    Musculoskeletal: Negative.    Skin: Negative.    Allergic/Immunologic: Negative.    Neurological: Negative.    Hematological: Negative.    Psychiatric/Behavioral: Negative.          Objective:         ? aspirin EC 81 mg tablet Take 81 mg by mouth daily.   ? atorvastatin (LIPITOR) 20 mg tablet Take one tablet by mouth daily.   ? digoxin (LANOXIN) 125 mcg (0.125 mg) tablet Take one tablet by mouth daily.   ? flecainide (TAMBOCOR) 100 mg tablet Take one tablet by mouth three times daily.   ? LINZESS 145 mcg capsule Take 145 mcg by mouth daily.   ? losartan (COZAAR) 100 mg tablet Take 1 tablet by mouth once daily   ? verapamil CR (CALAN-SR) 120 mg tablet Take one tablet by mouth twice daily.     Vitals:    08/04/20 1438   BP: 127/66   BP Source: Arm, Left Upper   Pulse: 58   Temp: 36.2 ?C (97.2 ?F)   Resp: 18   SpO2: 99% TempSrc: Temporal   PainSc: Six   Weight: 90.5 kg (199 lb 9.6 oz)   Height: 198.1 cm (6' 5.99)     Body mass index is 23.07 kg/m?Marland Kitchen     Physical Exam  Vitals reviewed.   Constitutional:       Appearance: Normal appearance.   Neurological:      Mental Status: He is alert.              Assessment and Plan:  Prostate cancer  -  Will obtain a PSA today  -  RTC in 6 months with a PSA

## 2020-08-05 ENCOUNTER — Encounter: Admit: 2020-08-05 | Discharge: 2020-08-05 | Payer: MEDICARE

## 2020-08-05 NOTE — Telephone Encounter
Called and spoke with patient; relayed PSA results and message from Dr. Jerline Pain. He voiced understanding and appreciation, no other questions at this time.

## 2020-08-05 NOTE — Telephone Encounter
-----   Message from Cindie Laroche, MD sent at 08/04/2020  6:01 PM CDT -----  Can you let him know his PSA.  Keep the plan as scheduled.  ----- Message -----  From: Buel Ream, In Results Misys  Sent: 08/04/2020   5:52 PM CDT  To: Cindie Laroche, MD

## 2020-08-13 ENCOUNTER — Encounter: Admit: 2020-08-13 | Discharge: 2020-08-13 | Payer: MEDICARE

## 2020-08-18 ENCOUNTER — Encounter: Admit: 2020-08-18 | Discharge: 2020-08-18 | Payer: MEDICARE

## 2020-08-21 ENCOUNTER — Encounter: Admit: 2020-08-21 | Discharge: 2020-08-21 | Payer: MEDICARE

## 2020-08-21 MED ORDER — ATORVASTATIN 20 MG PO TAB
ORAL_TABLET | Freq: Every day | 3 refills | Status: AC
Start: 2020-08-21 — End: ?

## 2020-08-22 ENCOUNTER — Encounter: Admit: 2020-08-22 | Discharge: 2020-08-22 | Payer: MEDICARE

## 2020-08-22 DIAGNOSIS — R002 Palpitations: Secondary | ICD-10-CM

## 2020-08-22 DIAGNOSIS — I480 Paroxysmal atrial fibrillation: Secondary | ICD-10-CM

## 2020-08-22 DIAGNOSIS — I4892 Unspecified atrial flutter: Secondary | ICD-10-CM

## 2020-08-22 MED ORDER — VERAPAMIL 120 MG PO TBER
ORAL_TABLET | Freq: Two times a day (BID) | 0 refills
Start: 2020-08-22 — End: ?

## 2020-08-22 MED ORDER — FLECAINIDE 100 MG PO TAB
ORAL_TABLET | Freq: Three times a day (TID) | 0 refills
Start: 2020-08-22 — End: ?

## 2020-09-08 ENCOUNTER — Encounter: Admit: 2020-09-08 | Discharge: 2020-09-08 | Payer: MEDICARE

## 2020-09-08 MED ORDER — DIGOXIN 125 MCG (0.125 MG) PO TAB
ORAL_TABLET | Freq: Every day | ORAL | 0 refills | 30.00000 days | Status: AC
Start: 2020-09-08 — End: ?

## 2020-10-22 ENCOUNTER — Encounter: Admit: 2020-10-22 | Discharge: 2020-10-22 | Payer: MEDICARE

## 2020-10-29 ENCOUNTER — Encounter: Admit: 2020-10-29 | Discharge: 2020-10-29 | Payer: MEDICARE

## 2020-10-29 DIAGNOSIS — I10 Essential (primary) hypertension: Secondary | ICD-10-CM

## 2020-10-29 DIAGNOSIS — M549 Dorsalgia, unspecified: Secondary | ICD-10-CM

## 2020-10-29 DIAGNOSIS — R002 Palpitations: Secondary | ICD-10-CM

## 2020-10-29 DIAGNOSIS — C61 Malignant neoplasm of prostate: Secondary | ICD-10-CM

## 2020-10-29 DIAGNOSIS — Z923 Personal history of irradiation: Secondary | ICD-10-CM

## 2020-10-29 DIAGNOSIS — R001 Bradycardia, unspecified: Secondary | ICD-10-CM

## 2020-10-29 DIAGNOSIS — I4891 Unspecified atrial fibrillation: Secondary | ICD-10-CM

## 2020-10-29 DIAGNOSIS — R0989 Other specified symptoms and signs involving the circulatory and respiratory systems: Secondary | ICD-10-CM

## 2020-10-29 DIAGNOSIS — M1990 Unspecified osteoarthritis, unspecified site: Secondary | ICD-10-CM

## 2020-10-29 MED ORDER — FLECAINIDE 150 MG PO TAB
150 mg | ORAL_TABLET | Freq: Two times a day (BID) | ORAL | 3 refills | 30.00000 days | Status: AC
Start: 2020-10-29 — End: ?

## 2020-10-29 MED ORDER — DIGOXIN 125 MCG (0.125 MG) PO TAB
125 ug | ORAL_TABLET | Freq: Every day | ORAL | 3 refills | 30.00000 days | Status: AC
Start: 2020-10-29 — End: ?

## 2020-10-29 MED ORDER — VERAPAMIL 120 MG PO TBER
120 mg | ORAL_TABLET | Freq: Two times a day (BID) | ORAL | 3 refills | 90.00000 days | Status: AC
Start: 2020-10-29 — End: ?

## 2020-10-29 MED ORDER — ATORVASTATIN 20 MG PO TAB
20 mg | ORAL_TABLET | Freq: Every day | ORAL | 3 refills | Status: AC
Start: 2020-10-29 — End: ?

## 2020-10-29 MED ORDER — LOSARTAN 100 MG PO TAB
100 mg | ORAL_TABLET | Freq: Every day | ORAL | 3 refills | 30.00000 days | Status: AC
Start: 2020-10-29 — End: ?

## 2020-12-25 ENCOUNTER — Encounter: Admit: 2020-12-25 | Discharge: 2020-12-25 | Payer: MEDICARE

## 2020-12-25 NOTE — Telephone Encounter
Returned patient's call re: PSA result at PCP office. Reviewed CE and found result (PSA 1.41) on 12/24/20. Patient is concerned, as that is a bit higher than his last result with [place], in July 2022. Advised patient that we would likely confirm lab value and there may or may not be imaging needed. Will route to Dr. Jerline Pain for next steps and determination of any orders needed, or if his January 2023 appointment should be moved up.

## 2021-01-08 ENCOUNTER — Ambulatory Visit: Admit: 2021-01-08 | Discharge: 2021-01-08 | Payer: MEDICARE

## 2021-01-08 ENCOUNTER — Encounter: Admit: 2021-01-08 | Discharge: 2021-01-08 | Payer: MEDICARE

## 2021-01-08 DIAGNOSIS — C61 Malignant neoplasm of prostate: Secondary | ICD-10-CM

## 2021-01-08 LAB — PROSTATIC SPECIFIC ANTIGEN-PSA: PROSTATIC SPEC AG: 0.8 ng/mL (ref <4.01)

## 2021-01-20 ENCOUNTER — Encounter: Admit: 2021-01-20 | Discharge: 2021-01-20 | Payer: MEDICARE

## 2021-01-20 NOTE — Telephone Encounter
Patient left message requesting PSA results- has upcoming visit. Will route to Dr. Jerline Pain for interpretation and return patient's call. Results are available in MyChart, but have not been viewed by patient.

## 2021-01-20 NOTE — Telephone Encounter
LVM for patient with PSA result, as well as information that DR. Parker planned to discuss at his visit, on 02/04/21. Advised patient that he could call back, if he has question or concern that he would like to relay, sooner.

## 2021-01-22 ENCOUNTER — Encounter: Admit: 2021-01-22 | Discharge: 2021-01-22 | Payer: MEDICARE

## 2021-01-22 NOTE — Telephone Encounter
Returned patient's phone call re: PSA results. Patient states he has not received his results yet. RN informed patient of results and stated Molly RN left a VM and Dr. Jerline Pain will discuss lab results at upcoming appt 02/04/21. Patient states his PCP did labwork 11/22 and his PSA results was 1.4 at that time. Patient stated he will discuss with Dr. Jerline Pain at upcoming appointment. Patient verbalized understanding and thankful for the return call.

## 2021-02-04 ENCOUNTER — Encounter: Admit: 2021-02-04 | Discharge: 2021-02-04 | Payer: MEDICARE

## 2021-02-04 DIAGNOSIS — I10 Essential (primary) hypertension: Secondary | ICD-10-CM

## 2021-02-04 DIAGNOSIS — C61 Malignant neoplasm of prostate: Secondary | ICD-10-CM

## 2021-02-04 DIAGNOSIS — M549 Dorsalgia, unspecified: Secondary | ICD-10-CM

## 2021-02-04 DIAGNOSIS — M1990 Unspecified osteoarthritis, unspecified site: Secondary | ICD-10-CM

## 2021-02-04 DIAGNOSIS — I4891 Unspecified atrial fibrillation: Secondary | ICD-10-CM

## 2021-02-04 NOTE — Assessment & Plan Note
I had the pleasure of visiting with Zachary Manning in clinic today.  He has good urinary control, no new urinary complaints.  He does feel that he empties his bladder after urination.  He describes his stream as a good full stream.  Most recent PSA was performed on 01/08/2021, PSA was 0.80 ng/mL, this is a stable value.    He did have a PET scan performed on 08/31/2018, which was negative for evidence of recurrence or persistent disease.    He has had a hematuria work-up and evaluation in 2021 which was negative.    He does have functional erections for sexual intercourse without the use of any medications or interventions.  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  He is currently is not sexually active.  He was inquiring if there was a treatment for the pyrone's disease, I inquired we have a colleague, Dr. Rolena Infante who would be able to assist him if he was interested for intervention in the future.  He verbalized understanding.    Plan:  1.  Return to clinic in 6 months with a PSA

## 2021-02-04 NOTE — Progress Notes
Name: Zachary Manning          MRN: [id number]      DOB: [birth year]      AGE: 71 y.o.   DATE OF SERVICE: 02/04/2021    Subjective:             Reason for Visit:  Follow Up and Prostate Cancer      Zachary Manning is a 70 y.o. male.     Cancer Staging  Prostate cancer [facility])  Staging form: Prostate, AJCC 8th Edition  - Clinical: Stage IIC (cT1c, cN0, cM0, PSA: 5.9, Grade Group: 3) - Unsigned      History of Present Illness  [name] is a pleasant 71 year old male, who had T1cN0 M0, grade group 3 disease in a single core biopsy.  He did receive SBRT to the prostate with short-term ADT in August 2019.  After completion of his therapy, he had a rising PSA, almost to 2.  He did have a PET scan performed on 08/31/2018, which was negative for evidence of recurrence or persistent disease.  Most recent PSA was performed on 01/08/2021, PSA was 0.80 ng/L, this is a stable value.    He has had a hematuria work-up and evaluation in 2021 which was negative.  He does have a history of a 5 mm left renal stone (from a CT) and a left inguinal hernia.    He does have occasional urgency.  States he urinates 1-2 times during the day.  He has occasional nocturia x1 maybe twice a week.  He feels that he empties his bladder after nation.  He denies hematuria or recent infections.  He describes his stream is a good full stream, occasionally slow.  He denies any hesitancy or intermittent break in his stream.    He does have functional erections for sexual intercourse without the use of any medications or interventions.  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  At this time he is currently not sexually active.       Review of Systems   Constitutional: Negative.  Negative for activity change, appetite change, chills, diaphoresis, fatigue, fever and unexpected weight change.   HENT: Negative.  Negative for congestion, hearing loss, mouth sores, rhinorrhea, sinus pressure, sore throat and trouble swallowing.    Eyes: Negative.  Negative for discharge and visual disturbance.   Respiratory: Negative.  Negative for apnea, cough, chest tightness, shortness of breath and wheezing.    Cardiovascular: Negative.  Negative for chest pain, palpitations and leg swelling.   Gastrointestinal: Negative.  Negative for abdominal pain, blood in stool, constipation, diarrhea, nausea, rectal pain and vomiting.   Endocrine: Negative.    Genitourinary: Negative.  Negative for decreased urine volume, difficulty urinating, dysuria, enuresis, flank pain, frequency, genital sores, hematuria, penile discharge, penile pain, penile swelling, scrotal swelling, testicular pain and urgency.   Musculoskeletal: Negative.  Negative for arthralgias, back pain, gait problem and myalgias.   Skin: Negative.  Negative for rash and wound.   Allergic/Immunologic: Negative.    Neurological: Negative.  Negative for dizziness, tremors, seizures, syncope, weakness, light-headedness, numbness and headaches.   Hematological: Negative.  Negative for adenopathy. Does not bruise/bleed easily.   Psychiatric/Behavioral: Negative.  Negative for agitation, behavioral problems, decreased concentration, dysphoric mood and sleep disturbance. The patient is not nervous/anxious.          Objective:         ?  aspirin EC 81 mg tablet Take 81 mg by mouth daily.   ? atorvastatin (LIPITOR) 20 mg tablet Take one tablet by mouth daily. Indications: high cholesterol   ? digoxin (LANOXIN) 125 mcg (0.125 mg) tablet Take one tablet by mouth daily.   ? flecainide (TAMBOCOR) 150 mg tablet Take one tablet by mouth twice daily.   ? LINZESS 145 mcg capsule Take 145 mcg by mouth daily.   ? losartan (COZAAR) 100 mg tablet Take one tablet by mouth daily.   ? verapamil CR (CALAN-SR) 120 mg tablet Take one tablet by mouth twice daily.     Vitals:    02/04/21 0804   BP: 126/67   BP Source: Arm, Left Upper   Pulse: 71   Temp: 36.3 ?C (97.4 ?F)   Resp: 18 SpO2: 98%   TempSrc: Temporal   PainSc: Six   Weight: 95.9 kg (211 lb 6.4 oz)   Height: 195.6 cm (6' 5)     Body mass index is 25.07 kg/m?Marland Kitchen     Pain Score: Six  Pain Loc: Neck    Fatigue Scale: 0-None    Pain Addressed:  N/A    Patient Evaluated for a Clinical Trial: No treatment clinical trial available for this patient.     [place] Cooperative Oncology Group performance status is 0, Fully active, able to carry on all pre-disease performance without restriction.Marland Kitchen     Physical Exam  Vitals reviewed.   Constitutional:       General: He is not in acute distress.     Appearance: Normal appearance. He is not ill-appearing.   HENT:      Head: Normocephalic and atraumatic.   Eyes:      General: No scleral icterus.     Extraocular Movements: Extraocular movements intact.      Conjunctiva/sclera: Conjunctivae normal.   Pulmonary:      Effort: Pulmonary effort is normal. No respiratory distress.   Abdominal:      General: There is no distension.      Palpations: Abdomen is soft.   Musculoskeletal:         General: No swelling. Normal range of motion.      Cervical back: Normal range of motion.   Skin:     General: Skin is warm and dry.   Neurological:      Mental Status: He is alert and oriented to person, place, and time.   Psychiatric:         Mood and Affect: Mood normal.         Behavior: Behavior normal.         Thought Content: Thought content normal.         Judgment: Judgment normal.               Assessment and Plan:  Problem   Prostate Cancer ([facility])      PSA history (up to last 5 years):   11/26/15  PSA  5.7 ng/mL  Free PSA:  0.95   %Free PSA:  16.7% free (per note)  08/23/16  PSA  5.88 ng/mL  Lab Results   Component Value Date/Time    PSA 1.94 07/18/2018 03:13 PM    PSA 0.74 04/26/2018 02:15 PM    PSA 0.67 01/05/2018 10:26 AM    PSA 0.20 10/05/2017 02:10 PM    PSA 4.86 (H) 07/14/2017 01:27 PM       09/28/16 -- Initial visit with Dr. Jimmey Ralph. DRE right firm nodule 8mm. Plan for  template biopsy.    10/05/16  Final Diagnosis:     A. Prostate, left medial apex, biopsy:   Benign prostatic glands and stroma.   Negative for high grade PIN or malignancy. ?     B. Prostate, left medial mid, biopsy:   Benign prostatic glands and stroma.   Negative for high grade PIN or malignancy. ?     C. Prostate, left medial base, biopsy:   Benign prostatic glands and stroma.   Negative for high grade PIN or malignancy. ?     D. Prostate, left lateral apex, biopsy:   Benign prostatic glands and stroma.   Negative for high grade PIN or malignancy. ? ?     E. Prostate, left lateral mid, biopsy:   Benign prostatic glands and stroma.   Negative for high grade PIN or malignancy. ? ?     F. Prostate, left lateral base, biopsy:   Benign prostatic glands and stroma.   Negative for high grade PIN or malignancy. ? ?     G. Prostate, right medial apex, biopsy:   Benign prostatic glands and stroma.   Negative for high grade PIN or malignancy. ?     H. Prostate, right medial mi, biopsy:   Prostatic adenocarcinoma Gleason grade 3+3=score of 6 (Grade group 1) in   1 of one core involving ?   10% of the needle core tissue and measuring 2 mm in length. See comment     I. Prostate, right medial base, biopsy:   Benign prostatic glands and stroma.   Negative for high grade PIN or malignancy. ? ?     J. Prostate, right lateral apex, biopsy:   Benign prostatic glands and stroma.   Negative for high grade PIN or malignancy. ?     K. Prostate, right lateral mid, biopsy:   Benign prostatic glands and stroma.   Negative for high grade PIN or malignancy. ? ?     L. Prostate, right lateral base, biopsy:   Benign prostatic glands and stroma.   Negative for high grade PIN or malignancy.     Mri of prostate:  IMPRESSION    1. Lenticular, moderately T2 hypointense lesion within the anterior   transition zone, mid gland to base, which may represent asymmetric   anterior fibromuscular stroma, though meets the T2 imaging criteria for a   PI-RADS 5 abnormality. Associated broad-based abutment and mild bulging of   the anterior prostate capsule without gross extraprostatic extension. This   lesion was contoured in DynaCAD for potential fusion biopsy.     05/11/2017: Fusion biopsy: 1of 13 cores positive.    Prostate, right medial mid, core biopsy: ?   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     Lupron injection: 06/16/2017    SBRT with Dr. Flonnie Hailstone, 08/21/2017 through 08/31/2017             Prostate cancer [facility])  I had the pleasure of visiting with Mr. Blenda Bridegroom in clinic today.  He has good urinary control, no new urinary complaints.  He does feel that he empties his bladder after urination.  He describes his stream as a good full stream.  Most recent PSA was performed on 01/08/2021, PSA was 0.80 ng/mL, this is a stable value.    He did have a PET scan performed on 08/31/2018, which was negative for evidence of recurrence or persistent disease.  He has had a hematuria work-up and evaluation in 2021 which was negative.    He does have functional erections for sexual intercourse without the use of any medications or interventions.  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  He is currently is not sexually active.  He was inquiring if there was a treatment for the pyrone's disease, I inquired we have a colleague, Dr. Larita Fife who would be able to assist him if he was interested for intervention in the future.  He verbalized understanding.    Plan:  1.  Return to clinic in 6 months with a PSA    San Jetty, APRN-NP  Urology      ATTESTATION    I personally interviewed and examined the patient.  I have reviewed the history, physical, impression and plan outlined by the Advanced Practice Provider (APP).    The patient presents with (HPI) a history of prostate cancer treated with stereotactic body radiotherapy in 2019.  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  On examination:   Physical Exam   Lab Results   Component Value Date    PSA 0.80 01/08/2021    PSA 0.83 08/04/2020    PSA 0.51 02/07/2020    PSA 0.64 07/26/2019    PSA 0.56 03/14/2019    TESTOSTER 405 07/18/2018    TESTOSTER 393 04/26/2018    TESTOSTER 430 01/05/2018       My impression is that he has prostate cancer without recurrence.  My plan is to have him return in 6 months with a PSA.  If he becomes sexually active or is bothered by his Peyronie's then we can proceed with a referral for therapy.      Staff name:  Myna Hidalgo, MD Date:  02/04/2021

## 2021-04-21 ENCOUNTER — Encounter: Admit: 2021-04-21 | Discharge: 2021-04-21 | Payer: MEDICARE

## 2021-04-29 ENCOUNTER — Encounter: Admit: 2021-04-29 | Discharge: 2021-04-29 | Payer: MEDICARE

## 2021-04-29 ENCOUNTER — Ambulatory Visit: Admit: 2021-04-29 | Discharge: 2021-04-29 | Payer: MEDICARE

## 2021-04-29 DIAGNOSIS — M549 Dorsalgia, unspecified: Secondary | ICD-10-CM

## 2021-04-29 DIAGNOSIS — I4891 Unspecified atrial fibrillation: Secondary | ICD-10-CM

## 2021-04-29 DIAGNOSIS — M25562 Pain in left knee: Secondary | ICD-10-CM

## 2021-04-29 DIAGNOSIS — M1712 Unilateral primary osteoarthritis, left knee: Secondary | ICD-10-CM

## 2021-04-29 DIAGNOSIS — M1990 Unspecified osteoarthritis, unspecified site: Secondary | ICD-10-CM

## 2021-04-29 DIAGNOSIS — I10 Essential (primary) hypertension: Secondary | ICD-10-CM

## 2021-04-29 NOTE — Progress Notes
Date of Service: 04/29/2021     Subjective:            Zachary Manning is a 71 y.o. male presenting with chronic L knee pain. Back in October, he was taking a shower then walking downstairs when his feet slipped and fell into a hyperflexion position. He has had soreness x 1 month, it self resolved then returned on its own. Pain is medial and sore to the touch. He has only been wearing a compression knee sleeve and patellar strap. He uses ibuprofen which doesn't help. Denies any mechanical symptoms.       Review of Systems   Constitutional: Negative for fever.   Musculoskeletal: Positive for arthralgias.   Skin: Negative.    Neurological: Negative for weakness and numbness.   All other systems reviewed and are negative.    Objective:         ? aspirin EC 81 mg tablet Take one tablet by mouth daily.   ? atorvastatin (LIPITOR) 20 mg tablet Take one tablet by mouth daily. Indications: high cholesterol   ? digoxin (LANOXIN) 125 mcg (0.125 mg) tablet Take one tablet by mouth daily.   ? flecainide (TAMBOCOR) 150 mg tablet Take one tablet by mouth twice daily.   ? LINZESS 145 mcg capsule Take one capsule by mouth daily.   ? losartan (COZAAR) 100 mg tablet Take one tablet by mouth daily.   ? verapamil CR (CALAN-SR) 120 mg tablet Take one tablet by mouth twice daily.     Vitals:    04/29/21 1426   PainSc: Four   Weight: 98 kg (216 lb)   Height: 195.6 cm (6' 5)     Body mass index is 25.61 kg/m?Marland Kitchen     Constitutional: appears well-developed and well-nourished in no acute distress  Head: Normocephalic and atraumatic.   Eyes: Conjunctivae and EOM are normal.  Neck: supple, FROM  Cardiovascular: Normal rate and intact distal pulses.   Pulmonary/Chest: Effort normal. No respiratory distress.   Neurological:  alert and oriented to person, place, and time.   Skin: warm and dry.   Psychiatric:  Normal mood and affect. Behavior is normal. Judgment and thought content normal.   Nursing note and vitals reviewed.    LEFT KNEE Observation: no swelling, ecchymosis, or deformity; normal gait   Range of Motion: -5 to 135 Deg with endpoint pain  Effusion: none   Joint Line Tenderness: medial   Tenderness: none   Patella: normal; no tenderness   Apprehension Test: negative   Patellar Grind: normal   Plica: None   Patellar Apprehension: negative   Notable VMO muscle good bilaterally   Lachman: negative ; End point: firm   Anterior Drawer: normal ; End point: firm   Valgus Stress for MCL: negative ; End point: firm   Varus Stress for LCL: negative ; End point: firm   Posterior Drawer: negative ; End point: firm   Sag sign: negative   McMurray: medial positive for pain  Leg lengths: grossly equal   Distal neurovascular intact       Assessment and Plan:  Primary osteoarthritis of left knee    71 year old male with bilateral OA of knees with exacerbation of left knee OA (L>R).  Given he is only tried knee sleeve and patellar bracing, we will optimize conservative measures including Voltaren gel, Tylenol/ibuprofen and icing regularly prior to cortisone injections.    1. Activity modification: avoid hyperflexion of knees or high impact activity, ice for 10-15 min  per hour as needed  2. Medication: Voltaren (diclofenac) gel 3-4x/day, Tylenol or Aleve as needed  3. Therapy: none today  4. Intervention: none today  5. Follow up: when pain returns for possible steroid injection           Please call or utilize MyChart for any clinical questions/concerns. For follow-up appointments please call [phone].    Staffed with Dr. Benjiman Core DO  Primary Care Sports Medicine Fellow  .

## 2021-04-29 NOTE — Patient Instructions
Your diagnosis is exacerbation of knee osteoarthritis.     Activity modification: avoid hyperflexion of knees or high impact activity, ice for 10-15 min per hour as needed  Medication: Voltaren (diclofenac) gel 3-4x/day, Tylenol or Aleve as needed  Therapy: none today  Intervention: none today  Follow up: when pain returns for possible steroid injection           Please call or utilize MyChart for any clinical questions/concerns. For follow-up appointments please call [phone].    Staffed with Dr. Kennith Gain DO  Primary Care Sports Medicine Fellow

## 2021-04-29 NOTE — Progress Notes
I personally introduced myself to patient and reviewed the assessment and treatment program and patient expressed full understanding and satisfaction with the encounter today.    Zachary Manning. [name], M.D.  Associate Professor  Market researcher, [employer]

## 2021-05-11 ENCOUNTER — Encounter: Admit: 2021-05-11 | Discharge: 2021-05-11 | Payer: MEDICARE

## 2021-08-02 ENCOUNTER — Encounter: Admit: 2021-08-02 | Discharge: 2021-08-02 | Payer: MEDICARE

## 2021-08-05 ENCOUNTER — Encounter: Admit: 2021-08-05 | Discharge: 2021-08-05 | Payer: MEDICARE

## 2021-08-05 ENCOUNTER — Ambulatory Visit: Admit: 2021-08-05 | Discharge: 2021-08-05 | Payer: MEDICARE

## 2021-08-05 DIAGNOSIS — I10 Essential (primary) hypertension: Secondary | ICD-10-CM

## 2021-08-05 DIAGNOSIS — I4891 Unspecified atrial fibrillation: Secondary | ICD-10-CM

## 2021-08-05 DIAGNOSIS — I4892 Unspecified atrial flutter: Secondary | ICD-10-CM

## 2021-08-05 DIAGNOSIS — R001 Bradycardia, unspecified: Secondary | ICD-10-CM

## 2021-08-05 DIAGNOSIS — M1990 Unspecified osteoarthritis, unspecified site: Secondary | ICD-10-CM

## 2021-08-05 DIAGNOSIS — Z923 Personal history of irradiation: Secondary | ICD-10-CM

## 2021-08-05 DIAGNOSIS — R0989 Other specified symptoms and signs involving the circulatory and respiratory systems: Secondary | ICD-10-CM

## 2021-08-05 DIAGNOSIS — R5382 Chronic fatigue, unspecified: Secondary | ICD-10-CM

## 2021-08-05 DIAGNOSIS — F1010 Alcohol abuse, uncomplicated: Secondary | ICD-10-CM

## 2021-08-05 DIAGNOSIS — M549 Dorsalgia, unspecified: Secondary | ICD-10-CM

## 2021-08-05 DIAGNOSIS — C61 Malignant neoplasm of prostate: Secondary | ICD-10-CM

## 2021-08-05 DIAGNOSIS — R002 Palpitations: Secondary | ICD-10-CM

## 2021-08-05 MED ORDER — FLECAINIDE 150 MG PO TAB
150 mg | ORAL_TABLET | Freq: Two times a day (BID) | ORAL | 3 refills | 30.00000 days | Status: AC
Start: 2021-08-05 — End: ?

## 2021-08-05 MED ORDER — LOSARTAN 100 MG PO TAB
100 mg | ORAL_TABLET | Freq: Every day | ORAL | 3 refills | 90.00000 days | Status: AC
Start: 2021-08-05 — End: ?

## 2021-08-05 MED ORDER — DIGOXIN 125 MCG (0.125 MG) PO TAB
125 ug | ORAL_TABLET | Freq: Every day | ORAL | 3 refills | 30.00000 days | Status: AC
Start: 2021-08-05 — End: ?

## 2021-08-05 MED ORDER — ATORVASTATIN 20 MG PO TAB
20 mg | ORAL_TABLET | Freq: Every day | ORAL | 3 refills | Status: AC
Start: 2021-08-05 — End: ?

## 2021-08-05 MED ORDER — VERAPAMIL 120 MG PO TBER
120 mg | ORAL_TABLET | Freq: Two times a day (BID) | ORAL | 3 refills | 90.00000 days | Status: AC
Start: 2021-08-05 — End: ?

## 2021-08-05 NOTE — Progress Notes
To our valued patient,     We have enrolled your heart monitor and requested it be sent to your home.  You should receive this within 2-3 business days. Please wear the monitor for 14 days. When you have completed the study, please remove the device, and mail it back to the company. Please call [employer] Customer Service at [phone] with questions about placement, troubleshooting, and insurance coverage. You can reach the [facility] ambulatory heart monitor team at [phone].      Your Heart Rhythm Management Team  Cardiovascular Medicine Department at [facility]              Ambulatory (External) Cardiac Monitor Enrollment Record     Placement Location: Home Enrollment  Vendor: [employer] ([employer])  Mobile Cardiac Telemetry (MCOT/MCT)?: No  Duration of Monitor (in days): 14  Monitor Diagnosis: Atrial Fibrillation (I48.91)  Secondary Monitor Diagnosis: Bradycardia (R00.1)  Ordering Provider: Nickolas Madrid  AMB Monitor Serial Number: Home  No data recorded    Start Time and Date: 08/05/21 4:12 PM   Patient Name: Zachary Manning  DOB: [birth year]  MRN: [id number]  Sex: male  Mobile Phone Number: [phone] (mobile)  Home Phone Number: [phone]  Patient Address: [address] [place] [address]  Insurance Coverage: [facility] MEDICARE REPLACEMENT - [phone]  Insurance ID: [id number]  Insurance Group #: [id number]  Insurance Subscriber: [name],[name]  Implanted Cardiac Device Information: No results found for: [id number]      Patient instructed to contact company phone number on the monitor box with questions regarding billing, placement, troubleshooting.     [name]    ____________________________________________________________    Clinic Staff:    ? Complete additional steps for documentation double check/Co-Sign.  ? In Follow-up, send chart upon closing encounter to P [facility] AMBULATORY MONITORS    HRM Ambulatory Monitoring Team:  1. Schedule on appropriate template and check-in.   Clinic Placement Schedule on clinic location [facility] schedule   Home Enrollment Schedule on Home Enrollment schedule ([facility])   Given to patient in clinic for self-placement Schedule on Home Enrollment schedule ([facility])   Inpatient Schedule on  [facility] AMBULATORY MONITORING template   2. Please enroll with appropriate vendor.

## 2021-08-05 NOTE — Progress Notes
Date of Service: 08/05/2021    Zachary Manning is a 71 y.o. male.       HPI      Zachary Manning is a 71 y.o. white male with a history of paroxysmal atrial fibrillation, CTI dependent atrial flutter previously noted on an EKG performed in 2014, patient did not undergo RFA, he was treated conservatively with AV node blocking agents consisting of verapamil and digoxin and also class Ic antiarrhythmic drug therapy, patient also has a history of prostate cancer, he underwent XRT, continues to follow at the cancer center at [facility].  He does not have any history of CAD.    Patient states that he has been experiencing more often episodes of heart palpitations that usually last a few seconds, they have not been associated with any hemodynamic instability.    Patient was previously anticoagulated with a vitamin K antagonist, this was discontinued in April 2014.    He does not report having episodes of chest pain.    He was evaluated with a perfusion imaging study in February 2020, he was not found to have ischemia, normal LVEF = 64%.    In addition, a 2D echo Doppler study performed in February 2020 demonstrated normal LVEF, normal diastolic function, right ventricular cavity was normal size and function, mild focal aortic valve sclerosis without stenosis and no other significant valvular abnormalities.       Vitals:    08/05/21 1353   BP: 138/78   BP Source: Arm, Left Upper   Pulse: 66   SpO2: 96%   O2 Device: None (Room air)   PainSc: Zero   Weight: 98.4 kg (217 lb)   Height: 198.1 cm (6' 6)     Body mass index is 25.08 kg/m?Marland Kitchen     Past Medical History  Patient Active Problem List    Diagnosis Date Noted   ? S/P radiation therapy 10/29/2020   ? Prostate cancer (HCC) 09/28/2016       PSA history (up to last 5 years):   11/26/15  PSA  5.7 ng/mL  Free PSA:  0.95   %Free PSA:  16.7% free (per note)  08/23/16  PSA  5.88 ng/mL  Lab Results   Component Value Date/Time    PSA 1.94 07/18/2018 03:13 PM    PSA 0.74 04/26/2018 02:15 PM    PSA 0.67 01/05/2018 10:26 AM    PSA 0.20 10/05/2017 02:10 PM    PSA 4.86 (H) 07/14/2017 01:27 PM       09/28/16 -- Initial visit with Dr. Jimmey Ralph. DRE right firm nodule 8mm. Plan for template biopsy.    10/05/16  Final Diagnosis:     A. Prostate, left medial apex, biopsy:   Benign prostatic glands and stroma.   Negative for high grade PIN or malignancy. ?     B. Prostate, left medial mid, biopsy:   Benign prostatic glands and stroma.   Negative for high grade PIN or malignancy. ?     C. Prostate, left medial base, biopsy:   Benign prostatic glands and stroma.   Negative for high grade PIN or malignancy. ?     D. Prostate, left lateral apex, biopsy:   Benign prostatic glands and stroma.   Negative for high grade PIN or malignancy. ? ?     E. Prostate, left lateral mid, biopsy:   Benign prostatic glands and stroma.   Negative for high grade PIN or malignancy. ? ?     F. Prostate, left lateral  base, biopsy:   Benign prostatic glands and stroma.   Negative for high grade PIN or malignancy. ? ?     G. Prostate, right medial apex, biopsy:   Benign prostatic glands and stroma.   Negative for high grade PIN or malignancy. ?     H. Prostate, right medial mi, biopsy:   Prostatic adenocarcinoma Gleason grade 3+3=score of 6 (Grade group 1) in   1 of one core involving ?   10% of the needle core tissue and measuring 2 mm in length. See comment     I. Prostate, right medial base, biopsy:   Benign prostatic glands and stroma.   Negative for high grade PIN or malignancy. ? ?     J. Prostate, right lateral apex, biopsy:   Benign prostatic glands and stroma.   Negative for high grade PIN or malignancy. ?     K. Prostate, right lateral mid, biopsy:   Benign prostatic glands and stroma.   Negative for high grade PIN or malignancy. ? ?     L. Prostate, right lateral base, biopsy:   Benign prostatic glands and stroma.   Negative for high grade PIN or malignancy.     Mri of prostate:  IMPRESSION    1. Lenticular, moderately T2 hypointense lesion within the anterior   transition zone, mid gland to base, which may represent asymmetric   anterior fibromuscular stroma, though meets the T2 imaging criteria for a   PI-RADS 5 abnormality. Associated broad-based abutment and mild bulging of   the anterior prostate capsule without gross extraprostatic extension. This   lesion was contoured in DynaCAD for potential fusion biopsy.     05/11/2017: Fusion biopsy: 1of 13 cores positive.    Prostate, right medial mid, core biopsy: ?   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     Lupron injection: 06/16/2017    SBRT with Dr. Flonnie Hailstone, 08/21/2017 through 08/31/2017         ? Atrial flutter (HCC) 02/11/2014   ? Poor dentition 10/11/2012   ? Heart palpitations 10/11/2012   ? Atrial fibrillation (HCC) 02/02/2011   ? Hypertension 02/02/2011   ? Bradycardia 02/02/2011   ? ETOH abuse 02/02/2011   ? Fatigue 02/02/2011         Review of Systems   Constitutional: Negative.   HENT: Negative.    Eyes: Negative.    Cardiovascular: Negative.    Respiratory: Negative.    Endocrine: Negative.    Hematologic/Lymphatic: Negative.    Skin: Negative.    Musculoskeletal: Negative.    Gastrointestinal: Negative.    Genitourinary: Negative.    Neurological: Negative.    Psychiatric/Behavioral: Negative.    Allergic/Immunologic: Negative.        Physical Exam  General Appearance: normal in appearance  Skin: warm, moist, no ulcers or xanthomas  Eyes: conjunctivae and lids normal, pupils are equal and round  Lips & Oral Mucosa: no pallor or cyanosis  Neck Veins: neck veins are flat, neck veins are not distended  Chest Inspection: chest is normal in appearance  Respiratory Effort: breathing comfortably, no respiratory distress  Auscultation/Percussion: lungs clear to auscultation, no rales or rhonchi, no wheezing  Cardiac Rhythm: regular rhythm and normal rate  Cardiac Auscultation: S1, S2 normal, no rub, no gallop  Murmurs: no murmur  Carotid Arteries: normal carotid upstroke bilaterally, no bruit  Lower Extremity Edema: no lower  extremity edema  Abdominal Exam: soft, non-tender, no masses, bowel sounds normal  Liver & Spleen: no organomegaly  Language and Memory: patient responsive and seems to comprehend information  Neurologic Exam: neurological assessment grossly intact      Cardiovascular Studies  Twelve-lead EKG demonstrates normal sinus rhythm, nonspecific IVCD, nonspecific ST segment T wave changes, no axis deviation, ventricular rate 63 bpm    Cardiovascular Health Factors  Vitals BP Readings from Last 3 Encounters:   08/05/21 138/78   02/04/21 126/67   10/29/20 122/74     Wt Readings from Last 3 Encounters:   08/05/21 98.4 kg (217 lb)   04/29/21 98 kg (216 lb)   02/04/21 95.9 kg (211 lb 6.4 oz)     BMI Readings from Last 3 Encounters:   08/05/21 25.08 kg/m?   04/29/21 25.61 kg/m?   02/04/21 25.07 kg/m?      Smoking Social History     Tobacco Use   Smoking Status Former   Smokeless Tobacco Current   ? Types: Chew   Vaping Use   ? Vaping Use: Never used      Lipid Profile Cholesterol   Date Value Ref Range Status   12/24/2020 150  Final     HDL   Date Value Ref Range Status   12/24/2020 47  Final     LDL   Date Value Ref Range Status   12/24/2020 78  Final     Triglycerides   Date Value Ref Range Status   12/24/2020 128  Final      Blood Sugar No results found for: HGBA1C  Glucose   Date Value Ref Range Status   12/24/2020 96  Final   04/18/2020 87  Final   03/11/2018 100  Final          Problems Addressed Today  Encounter Diagnoses   Name Primary?   ? Atrial fibrillation, unspecified type (HCC) Yes   ? Primary hypertension    ? Atrial flutter, unspecified type (HCC)    ? Bradycardia    ? ETOH abuse    ? Chronic fatigue    ? Heart palpitations    ? S/P radiation therapy    ? Prostate cancer (HCC)    ? Cardiovascular symptoms        Assessment and Plan Assessment:    1.  Occasional and fairly rarely events of symptomatic heart palpitations-patient states that usually these last for a few seconds but they have been occurring more frequently  2.  History of atrial fibrillation/atrial flutter  3.  History of CTI dependent atrial flutter on a twelve-lead EKG dated 01/22/2013  Patient did not undergo RFA  Patient was managed conservatively, he converted to normal sinus rhythm, has been able to maintain sinus mechanism  4.  History of anticoagulation with a vitamin K antagonist-this was discontinued in April 2014  5.  Mildly sclerotic aortic valve without significant stenosis or regurgitation  6.  History of prostate cancer, status post radiation therapy-currently in remission, patient continues to follow at the oncology center at [facility]  7.  Chest Vascor is 1-patient is intermediate risk, yearly risk of stroke is 1.3%    Plan:    1.  Further evaluation with a 14 days Holter monitor to assess patient's heart rhythm and ensure that he does not have significant recurrence of the atrial arrhythmias-we will follow-up on the results and will call if further recommendations  2.  Continue all other current medications  3.  Follow-up office visit in 6 months    Total Time Today was 40 minutes in the following activities: Preparing to see the patient, Obtaining and/or reviewing separately obtained history, Performing a medically appropriate examination and/or evaluation, Counseling and educating the patient/family/caregiver, Ordering medications, tests, or procedures, Referring and communication with other health care professionals (when not separately reported), Documenting clinical information in the electronic or other health record, Independently interpreting results (not separately reported) and communicating results to the patient/family/caregiver and Care coordination (not separately reported)         Current Medications (including today's revisions)  ? aspirin EC 81 mg tablet Take one tablet by mouth daily.   ? atorvastatin (LIPITOR) 20 mg tablet Take one tablet by mouth daily. Indications: high cholesterol   ? digoxin (LANOXIN) 125 mcg (0.125 mg) tablet Take one tablet by mouth daily.   ? flecainide (TAMBOCOR) 150 mg tablet Take one tablet by mouth twice daily.   ? LINZESS 145 mcg capsule Take one capsule by mouth daily as needed.   ? losartan (COZAAR) 100 mg tablet Take one tablet by mouth daily.   ? verapamil CR (CALAN-SR) 120 mg tablet Take one tablet by mouth twice daily.

## 2021-08-10 ENCOUNTER — Encounter: Admit: 2021-08-10 | Discharge: 2021-08-10 | Payer: MEDICARE

## 2021-08-10 DIAGNOSIS — M1990 Unspecified osteoarthritis, unspecified site: Secondary | ICD-10-CM

## 2021-08-10 DIAGNOSIS — I4891 Unspecified atrial fibrillation: Secondary | ICD-10-CM

## 2021-08-10 DIAGNOSIS — C61 Malignant neoplasm of prostate: Secondary | ICD-10-CM

## 2021-08-10 DIAGNOSIS — M549 Dorsalgia, unspecified: Secondary | ICD-10-CM

## 2021-08-10 DIAGNOSIS — I10 Essential (primary) hypertension: Secondary | ICD-10-CM

## 2021-08-10 LAB — PROSTATIC SPECIFIC ANTIGEN-PSA: PROSTATIC SPEC AG: 0.5 ng/mL — ABNORMAL HIGH (ref <4.01)

## 2021-08-10 NOTE — Progress Notes
Name: Zachary Manning          MRN: [id number]      DOB: July 20, 1950      AGE: 71 y.o.   DATE OF SERVICE: 08/10/2021    Subjective:             Reason for Visit:  Follow Up      Zachary Manning is a 71 y.o. male.      Cancer Staging   Prostate cancer [facility])  Staging form: Prostate, AJCC 8th Edition  - Clinical: Stage IIC (cT1c, cN0, cM0, PSA: 5.9, Grade Group: 3) - Unsigned      History of Present Illness  Zachary Manning is a 71 y.o. male who has a history of grade group 3 prostate cancer treated with stereotactic body radiotherapy in August of 2019 with a short course of androgen deprivation therapy.  He has hd a slowly falling PSA since radiation and is overall doing well today other than some arthritis.  He is voiding well and denies any new hematuria (prior history of post-radiation hematuria with a negative work-up).  He has functional erections with a stable curvature.        Review of Systems   HENT: Positive for congestion.    Respiratory: Positive for cough.    Gastrointestinal: Positive for constipation.   Musculoskeletal: Positive for arthralgias and back pain.         Objective:         ? aspirin EC 81 mg tablet Take one tablet by mouth daily.   ? atorvastatin (LIPITOR) 20 mg tablet Take one tablet by mouth daily. Indications: high cholesterol   ? digoxin (LANOXIN) 125 mcg (0.125 mg) tablet Take one tablet by mouth daily.   ? flecainide (TAMBOCOR) 150 mg tablet Take one tablet by mouth twice daily.   ? LINZESS 145 mcg capsule Take one capsule by mouth daily as needed.   ? losartan (COZAAR) 100 mg tablet Take one tablet by mouth daily.   ? verapamil CR (CALAN SR) 120 mg tablet Take one tablet by mouth twice daily.     Vitals:    08/10/21 0810   BP: 126/60   BP Source: Arm, Left Upper   Pulse: 64   Temp: 36.8 ?C (98.3 ?F)   SpO2: 97%   TempSrc: Temporal   PainSc: Five   Weight: 96.3 kg (212 lb 6.4 oz)   Height: 195.6 cm (6' 5)     Body mass index is 25.19 kg/m?Marland Kitchen     Pain Score: Five  Pain Loc: (arthritis (head to toe))    Fatigue Scale: 0-None    Pain Addressed:  N/A    Patient Evaluated for a Clinical Trial: Patient not eligible for a treatment trial (including not needing treatment, needs palliative care, in remission).     [place] Cooperative Oncology Group performance status is 0, Fully active, able to carry on all pre-disease performance without restriction.Marland Kitchen     Physical Exam   Well developed, well appearing man in no acute distress.  He appears his stated age.    Breathing is non-labored, chest rise is equal bilaterally.  Neurologic exam is non-focal.  Mood and affect are normal.    Lab Results   Component Value Date    PSA 0.80 01/08/2021    PSA 1.41 12/24/2020    PSA 0.83 08/04/2020    PSA 0.51 02/07/2020    PSA 0.64 07/26/2019    TESTOSTER 405 07/18/2018    TESTOSTER  393 04/26/2018    TESTOSTER 430 01/05/2018               Assessment and Plan:  Prostate cancer - grade group 3 status post stereotactic body radiotherapy  -  Will obtain a PSA and notify him with the results  -  Return to clinic in 1 year with a PSA

## 2021-08-27 ENCOUNTER — Encounter: Admit: 2021-08-27 | Discharge: 2021-08-27 | Payer: MEDICARE

## 2021-08-27 NOTE — Telephone Encounter
-----   Message from Vertell Novak, MD sent at 08/26/2021  7:40 PM CDT -----  Please let the patient know that the event monitor did not demonstrate any significant rhythm abnormalities, he was predominantly normal sinus rhythm.  We will continue the same medications.    Thank you      ----- Message -----  From: Vertell Novak, MD  Sent: 08/26/2021   7:35 PM CDT  To: Vertell Novak, MD

## 2021-08-27 NOTE — Telephone Encounter
Called and discussed results with patient.  No questions at this time.  Pt will callback with any questions, concerns or problems.

## 2022-02-22 ENCOUNTER — Encounter: Admit: 2022-02-22 | Discharge: 2022-02-22 | Payer: MEDICARE

## 2022-02-22 DIAGNOSIS — I10 Essential (primary) hypertension: Secondary | ICD-10-CM

## 2022-02-22 DIAGNOSIS — I4892 Unspecified atrial flutter: Secondary | ICD-10-CM

## 2022-02-22 DIAGNOSIS — R002 Palpitations: Secondary | ICD-10-CM

## 2022-02-22 DIAGNOSIS — I4891 Unspecified atrial fibrillation: Secondary | ICD-10-CM

## 2022-02-22 NOTE — Telephone Encounter
Seen in ER discussion with Dr Cephus Slater requested follow up in 1 wk.

## 2022-02-22 NOTE — Telephone Encounter
Pt called stating he has been in atrial fibrillation since yesterday since 1730 constantly.. No lightheadedness, dizziness or SOA. Pt having constant palpitations. No BP available for pt.  Pt reports hr 150 bpm. 07/2021 monitor did not demonstrate any significant rhythm abnormalities with predominately sinus rhythm. No afib or flutter noted on Zio XT report. Pt has not missed any of his medications. Pt not on a NOAC or warfarin. Pt was on warfarin in the past but was discontinued in November 2013. It was recommended pt proceed to nearest ER which is [facility]. He states his brother will drive him to ER. Discussed with pt to go right away since he has been in afib since 02/21/22 starting around 1730. We discussed the risk of blood clotting that could lead to severe complications. Pt agreeable to plan.

## 2022-02-28 ENCOUNTER — Encounter: Admit: 2022-02-28 | Discharge: 2022-02-28 | Payer: MEDICARE

## 2022-03-01 ENCOUNTER — Encounter: Admit: 2022-03-01 | Discharge: 2022-03-01 | Payer: MEDICARE

## 2022-03-01 DIAGNOSIS — R0989 Other specified symptoms and signs involving the circulatory and respiratory systems: Secondary | ICD-10-CM

## 2022-03-01 DIAGNOSIS — M1990 Unspecified osteoarthritis, unspecified site: Secondary | ICD-10-CM

## 2022-03-01 DIAGNOSIS — I10 Essential (primary) hypertension: Secondary | ICD-10-CM

## 2022-03-01 DIAGNOSIS — I4891 Unspecified atrial fibrillation: Secondary | ICD-10-CM

## 2022-03-01 DIAGNOSIS — I4892 Unspecified atrial flutter: Secondary | ICD-10-CM

## 2022-03-01 DIAGNOSIS — M549 Dorsalgia, unspecified: Secondary | ICD-10-CM

## 2022-03-01 DIAGNOSIS — M542 Cervicalgia: Secondary | ICD-10-CM

## 2022-03-01 DIAGNOSIS — R002 Palpitations: Secondary | ICD-10-CM

## 2022-03-01 NOTE — Progress Notes
Date of Service: 03/01/2022    Zachary Manning is a 72 y.o. male.       HPI     Zachary Manning was in the [place] clinic for a post-ED visit today.  He's a patient of Dr. Avie Arenas and scheduled to see her later in the month.    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  Zachary Manning has always been very aware of his AF.  He had a football-related neck injury at age 89 and he still thinks that the origin of his AF was related to that trauma.  He used to be a fairly heavy drinker, but acknowledges the connection between alcohol consumption and atrial fibrillation and says that he now only has a couple of beers every once in a while.    PAF since 72 years old.  One cardioversion 12 years ago.  He's been on digoxin since he was 72 years old.  Started flecainide in 2015 and only one recurrence last weekend.  He's been on 300 mg/day for years.  He spontaneously converted to sinus rhythm while in the ED.  He'd been in AF for 16-18 hours.  Eliquis was prescribed, but he hasn't started it yet.  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    When he's in AF he feels a definite sense of irregularity of his pulse.       Vitals:    03/01/22 0829   BP: (!) 148/72   BP Source: Arm, Left Upper   Pulse: 68   SpO2: 99%   O2 Device: None (Room air)   PainSc: Six   Weight: 101.4 kg (223 lb 9.6 oz)   Height: 198.1 cm (6' 6)     Body mass index is 25.84 kg/m?Marland Kitchen     Past Medical History  Patient Active Problem List    Diagnosis Date Noted    S/P radiation therapy 10/29/2020    Prostate cancer (HCC) 09/28/2016       PSA history (up to last 5 years):   11/26/15  PSA  5.7 ng/mL  Free PSA:  0.95   %Free PSA:  16.7% free (per note)  08/23/16  PSA  5.88 ng/mL  Lab Results   Component Value Date/Time    PSA 1.94 07/18/2018 03:13 PM    PSA 0.74 04/26/2018 02:15 PM    PSA 0.67 01/05/2018 10:26 AM    PSA 0.20 10/05/2017 02:10 PM    PSA 4.86 (H) 07/14/2017 01:27 PM       09/28/16 -- Initial visit with Dr. Jimmey Ralph. DRE right firm nodule 8mm. Plan for template biopsy.    10/05/16  Final Diagnosis:     A. Prostate, left medial apex, biopsy:   Benign prostatic glands and stroma.   Negative for high grade PIN or malignancy.       B. Prostate, left medial mid, biopsy:   Benign prostatic glands and stroma.   Negative for high grade PIN or malignancy.       C. Prostate, left medial base, biopsy:   Benign prostatic glands and stroma.   Negative for high grade PIN or malignancy.       D. Prostate, left lateral apex, biopsy:   Benign prostatic glands and stroma.   Negative for high grade PIN or malignancy.         E. Prostate, left lateral mid, biopsy:   Benign prostatic glands and stroma.  Negative for high grade PIN or malignancy.         F. Prostate, left lateral base, biopsy:   Benign prostatic glands and stroma.   Negative for high grade PIN or malignancy.         G. Prostate, right medial apex, biopsy:   Benign prostatic glands and stroma.   Negative for high grade PIN or malignancy.       H. Prostate, right medial mi, biopsy:   Prostatic adenocarcinoma Gleason grade 3+3=score of 6 (Grade group 1) in   1 of one core involving     10% of the needle core tissue and measuring 2 mm in length. See comment     I. Prostate, right medial base, biopsy:   Benign prostatic glands and stroma.   Negative for high grade PIN or malignancy.         J. Prostate, right lateral apex, biopsy:   Benign prostatic glands and stroma.   Negative for high grade PIN or malignancy.       K. Prostate, right lateral mid, biopsy:   Benign prostatic glands and stroma.   Negative for high grade PIN or malignancy.         L. Prostate, right lateral base, biopsy:   Benign prostatic glands and stroma.   Negative for high grade PIN or malignancy.     Mri of prostate:  IMPRESSION    1. Lenticular, moderately T2 hypointense lesion within the anterior   transition zone, mid gland to base, which may represent asymmetric   anterior fibromuscular stroma, though meets the T2 imaging criteria for a   PI-RADS 5 abnormality. Associated broad-based abutment and mild bulging of   the anterior prostate capsule without gross extraprostatic extension. This   lesion was contoured in DynaCAD for potential fusion biopsy.     05/11/2017: Fusion biopsy: 1of 13 cores positive.    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     Lupron injection: 06/16/2017    SBRT with Dr. Flonnie Hailstone, 08/21/2017 through 08/31/2017          Atrial flutter (HCC) 02/11/2014    Poor dentition 10/11/2012    Heart palpitations 10/11/2012    Atrial fibrillation (HCC) 02/02/2011    Hypertension 02/02/2011    Bradycardia 02/02/2011    ETOH abuse 02/02/2011    Fatigue 02/02/2011         Review of Systems   Constitutional: Negative.   HENT: Negative.     Eyes: Negative.    Cardiovascular: Negative.    Respiratory: Negative.     Endocrine: Negative.    Hematologic/Lymphatic: Negative.    Skin: Negative.    Musculoskeletal: Negative.    Gastrointestinal: Negative.    Genitourinary: Negative.    Neurological: Negative.    Psychiatric/Behavioral: Negative.     Allergic/Immunologic: Negative.        Physical Exam    Physical Exam   General Appearance: no distress   Skin: warm, no ulcers or xanthomas   Digits and Nails: no cyanosis or clubbing   Eyes: conjunctivae and lids normal, pupils are equal and round   Teeth/Gums/Palate: dentition unremarkable, no lesions   Lips & Oral Mucosa: no pallor or cyanosis   Neck Veins: normal JVP , neck veins are not distended   Thyroid: no nodules, masses, tenderness or enlargement  Chest Inspection: chest is normal in appearance   Respiratory Effort: breathing comfortably, no respiratory distress   Auscultation/Percussion: lungs clear to auscultation, no rales or rhonchi, no wheezing   PMI: PMI not enlarged or displaced   Cardiac Rhythm: regular rhythm and normal rate   Cardiac Auscultation: S1, S2 normal, no rub, no gallop   Murmurs: no murmur   Peripheral Circulation: normal peripheral circulation   Carotid Arteries: normal carotid upstroke bilaterally, no bruits   Radial Arteries: normal symmetric radial pulses   Abdominal Aorta: no abdominal aortic bruit   Pedal Pulses: normal symmetric pedal pulses   Lower Extremity Edema: no lower extremity edema   Abdominal Exam: soft, non-tender, no masses, bowel sounds normal   Liver & Spleen: no organomegaly   Gait & Station: walks without assistance   Muscle Strength: normal muscle tone   Orientation: oriented to time, place and person   Affect & Mood: appropriate and sustained affect   Language and Memory: patient responsive and seems to comprehend information   Neurologic Exam: neurological assessment grossly intact   Other: moves all extremities      Cardiovascular Studies    EKG:  SR, rate 59.  First-degree AV block.  Borderline IVCD, QRS duration 130 msec    Cardiovascular Health Factors  Vitals BP Readings from Last 3 Encounters:   03/01/22 (!) 148/72   08/10/21 126/60   08/05/21 138/78     Wt Readings from Last 3 Encounters:   03/01/22 101.4 kg (223 lb 9.6 oz)   08/10/21 96.3 kg (212 lb 6.4 oz)   08/05/21 98.4 kg (217 lb)     BMI Readings from Last 3 Encounters:   03/01/22 25.84 kg/m?   08/10/21 25.19 kg/m?   08/05/21 25.08 kg/m?      Smoking Social History     Tobacco Use   Smoking Status Former   Smokeless Tobacco Current    Types: Chew      Lipid Profile Cholesterol   Date Value Ref Range Status   12/24/2020 150  Final     HDL   Date Value Ref Range Status   12/24/2020 47  Final     LDL   Date Value Ref Range Status   12/24/2020 78  Final     Triglycerides   Date Value Ref Range Status   12/24/2020 128  Final      Blood Sugar No results found for: HGBA1C  Glucose   Date Value Ref Range Status   02/22/2022 96  Final   12/24/2020 96  Final   04/18/2020 87  Final Problems Addressed Today  Encounter Diagnoses   Name Primary?    Cardiovascular symptoms Yes    Atrial flutter, unspecified type (HCC)     Primary hypertension     Atrial fibrillation, unspecified type (HCC)     Heart palpitations     Neck pain        Assessment and Plan       Atrial fibrillation [facility])  He is going to keep his appointment with Dr. Radford Pax later this month.  He had stress testing done in 2020 and remains on flecainide.  They have had a discussion about ablation in the past.  He remains reluctant to use chronic oral anticoagulation because he is adamant that he recognizes recurrences of his atrial fibrillation.  I have left him on aspirin at this point and told him that I do not think he necessarily needs to start Eliquis.  I did  mention the fact that many patients have more AF burden than they recognize but he seems pretty convinced that he will know if he has another episode of atrial fibrillation that lasts for several hours.      Current Medications (including today's revisions)   aspirin EC 81 mg tablet Take one tablet by mouth daily.    atorvastatin (LIPITOR) 20 mg tablet Take one tablet by mouth daily. Indications: high cholesterol    digoxin (LANOXIN) 125 mcg (0.125 mg) tablet Take one tablet by mouth daily.    flecainide (TAMBOCOR) 150 mg tablet Take one tablet by mouth twice daily.    LINZESS 145 mcg capsule Take one capsule by mouth daily as needed.    losartan (COZAAR) 100 mg tablet Take one tablet by mouth daily.    verapamil CR (CALAN SR) 120 mg tablet Take one tablet by mouth twice daily.     Total time spent on today's office visit was 30 minutes.  This includes face-to-face in person visit with patient as well as nonface-to-face time including review of the EMR, outside records, labs, radiologic studies, echocardiogram & other cardiovascular studies, formation of treatment plan, after visit summary, future disposition, and lastly on documentation.

## 2022-03-01 NOTE — Assessment & Plan Note
He is going to keep his appointment with Dr. Jiles Harold later this month.  He had stress testing done in 2020 and remains on flecainide.  They have had a discussion about ablation in the past.  He remains reluctant to use chronic oral anticoagulation because he is adamant that he recognizes recurrences of his atrial fibrillation.  I have left him on aspirin at this point and told him that I do not think he necessarily needs to start Eliquis.  I did mention the fact that many patients have more AF burden than they recognize but he seems pretty convinced that he will know if he has another episode of atrial fibrillation that lasts for several hours.

## 2022-03-03 ENCOUNTER — Encounter: Admit: 2022-03-03 | Discharge: 2022-03-03 | Payer: MEDICARE

## 2022-03-03 ENCOUNTER — Ambulatory Visit: Admit: 2022-03-03 | Discharge: 2022-03-03 | Payer: MEDICARE

## 2022-03-03 DIAGNOSIS — I10 Essential (primary) hypertension: Secondary | ICD-10-CM

## 2022-03-03 DIAGNOSIS — M5030 Other cervical disc degeneration, unspecified cervical region: Secondary | ICD-10-CM

## 2022-03-03 DIAGNOSIS — M5412 Radiculopathy, cervical region: Secondary | ICD-10-CM

## 2022-03-03 DIAGNOSIS — M1990 Unspecified osteoarthritis, unspecified site: Secondary | ICD-10-CM

## 2022-03-03 DIAGNOSIS — M4802 Spinal stenosis, cervical region: Secondary | ICD-10-CM

## 2022-03-03 DIAGNOSIS — I4891 Unspecified atrial fibrillation: Secondary | ICD-10-CM

## 2022-03-03 DIAGNOSIS — M549 Dorsalgia, unspecified: Secondary | ICD-10-CM

## 2022-03-03 MED ORDER — GABAPENTIN 300 MG PO CAP
300 mg | ORAL_CAPSULE | ORAL | 1 refills | Status: AC
Start: 2022-03-03 — End: ?

## 2022-03-03 NOTE — Progress Notes
[facility] Interventional Pain  Subjective     Chief Complaint:   Chief Complaint   Patient presents with    Middle Back - Pain    Neck - Pain    New Patient     Neck and lower back pain         HPI: Zachary Manning is a pleasant 72 y.o. male who is referred for evaluation of pain and  has a past medical history of Arthritis, Atrial fibrillation (HCC), Back pain, and Hypertension (02/02/2011).  He describes the pain as follows:  - Pain started greater than 1 year  - He localizes the pain to base of neck with radiation to base of skull  - Radiation of pain: Minimal  - Numbness/tingling: None  - Initial inciting injury or event: No - does have hx of football injury in 6th grade  - He describes the pain as Aching  - Alleviating factors: Rest  - Aggravating factors: [place]  - Long hx of going to chiropractor but has not been helping as much lately      PRIOR MEDICATIONS:   Effective  Acetaminophen    Ineffective    Unable to tolerate  NSAID - cardiac hx, no stents or MI    Never  Gabapentin   Lyrica  Ami/Nortriptyline  Cymbalta  Tizanidine    PRIOR INTERVENTIONS:  Spine surgery:        Effective  Chiropractor - less effective recently    Ineffective  PT    - Anticoagulants:     aspirin EC  ,,No         ROS: A 10-point review of systems was negative except as noted above and below.    Past Medical History:  Medical History:   Diagnosis Date    Arthritis     Atrial fibrillation (HCC)     Back pain     Hypertension 02/02/2011       Family History:  Family History   Problem Relation Age of Onset    Heart Attack Father 84       Social History:  Lives in [place] [id number]  Social History     Socioeconomic History    Marital status: Divorced   Tobacco Use    Smoking status: Former    Smokeless tobacco: Current     Types: Catering manager Use: Never used   Substance and Sexual Activity    Alcohol use: No     Comment: Quit drinking 2 weeks ago.    Drug use: No       Allergies:  Allergies   Allergen Reactions    Sulfa Dyne RASH    Lisinopril COUGH       Medications:    Current Outpatient Medications:     aspirin EC 81 mg tablet, Take one tablet by mouth daily., Disp: , Rfl:     atorvastatin (LIPITOR) 20 mg tablet, Take one tablet by mouth daily. Indications: high cholesterol, Disp: 90 tablet, Rfl: 3    digoxin (LANOXIN) 125 mcg (0.125 mg) tablet, Take one tablet by mouth daily., Disp: 90 tablet, Rfl: 3    flecainide (TAMBOCOR) 150 mg tablet, Take one tablet by mouth twice daily., Disp: 180 tablet, Rfl: 3    LINZESS 145 mcg capsule, Take one capsule by mouth daily as needed., Disp: , Rfl:     losartan (COZAAR) 100 mg tablet, Take one tablet by mouth daily., Disp: 90 tablet,  Rfl: 3    verapamil CR (CALAN SR) 120 mg tablet, Take one tablet by mouth twice daily., Disp: 180 tablet, Rfl: 3    Oswestry Total Score:: 38  No data recorded    Physical examination:   Pulse (P) 65  - Ht 195.6 cm (6' 5)  - Wt 102.1 kg (225 lb)  - SpO2 (P) 96%  - BMI 26.68 kg/m?   Pain Score: Five    General: Alert, cooperative, no acute distress  HEENT: Normocephalic, atraumatic  Neck: Supple  Lungs: Unlabored respirations  Heart: Regular rate  Skin: Warm and dry to touch  Abdomen: Nondistended    Cervical spine:  Cervical tenderness: Yes  Occipital tenderness: No  Pain with extension: Yes  Pain with lateral flexion: Bilateral  Limited neck ROM: Yes  Sensation to light touch: Intact and equal in the bilateral upper extremities  Strength: 5/5 bilaterally in the flexors and extensors of the bilateral upper extremities  Hoffman sign: Negative bilaterally    Neurological: Alert and oriented x3.    MRI C-spine 2017: At least moderate central stenosis C5-6 and C6-7    Last Cr and LFT's:  Creatinine   Date Value Ref Range Status   02/22/2022 0.94  Final     AST (SGOT)   Date Value Ref Range Status   02/22/2022 20  Final     ALT (SGPT)   Date Value Ref Range Status   02/22/2022 15  Final     Alk Phosphatase   Date Value Ref Range Status 02/22/2022 74  Final     Total Bilirubin   Date Value Ref Range Status   02/22/2022 1.67 (H) 0.2 - 1.2 Final          Assessment:  Zachary Manning is a 72 y.o. male who presents for evaluation of pain. Based on history, physical exam, and imaging, the pain complaints are most likely due to:    1. Cervical radiculopathy        2. DDD (degenerative disc disease), cervical        3. Cervical spinal stenosis            He has had an adequate trial of rest, exercise, multimodal treatment, and the passage of time without improvement of symptoms. The pain has significant impact on ability to perform ADLs and quality of life.    Plan:  Medication: Trial gabapentin  Diagnostics: C-spine xrays and MRI  Interventions: CESI C7-T1 pending imaging review    Risks/benefits of all pharmacologic and interventional treatments discussed and questions answered.

## 2022-03-03 NOTE — Patient Instructions
It was nice to see you today. Thank you for choosing to visit our clinic.       Your time is important and if you had to wait today, we do apologize. Our goal is to run exactly on time; however, on occasion, we get behind in clinic due to unexpected patient issues. Thank you for your patience.    General Instructions:  How to reach me: Please send a MyChart message to the [facility] or leave a voicemail on [phone] with Kelsey, RN.   How to get a medication refill: Please use the MyChart Refill request or contact your pharmacy directly to request medication refills. We do not do same day refills on controlled substances.  How to receive your test results: If you have signed up for MyChart, you will receive your test results and messages from me this way. Otherwise, you will get a phone call or letter. If you are expecting results and have not heard from my office within 2 weeks of your testing, please send a MyChart message or call my office.  Scheduling: Our scheduling phone number is [phone].  Appointment Reminders on your cell phone: Communication preferences can be managed in MyChart to ensure you receive important appointment notifications.  Support for many chronic illnesses is available through [facility]: [facility] or [phone].  For questions on nights, weekends or holidays, call the Operator at [phone], and ask for the doctor on call for Anesthesia Pain.      Again, thank you for coming in today.

## 2022-03-22 ENCOUNTER — Encounter: Admit: 2022-03-22 | Discharge: 2022-03-22 | Payer: MEDICARE

## 2022-03-22 DIAGNOSIS — I10 Essential (primary) hypertension: Secondary | ICD-10-CM

## 2022-03-22 DIAGNOSIS — Z923 Personal history of irradiation: Secondary | ICD-10-CM

## 2022-03-22 DIAGNOSIS — C61 Malignant neoplasm of prostate: Secondary | ICD-10-CM

## 2022-03-22 DIAGNOSIS — I4892 Unspecified atrial flutter: Secondary | ICD-10-CM

## 2022-03-22 DIAGNOSIS — I4891 Unspecified atrial fibrillation: Secondary | ICD-10-CM

## 2022-03-22 DIAGNOSIS — K089 Disorder of teeth and supporting structures, unspecified: Secondary | ICD-10-CM

## 2022-03-22 DIAGNOSIS — R0989 Other specified symptoms and signs involving the circulatory and respiratory systems: Secondary | ICD-10-CM

## 2022-03-22 DIAGNOSIS — E7800 Pure hypercholesterolemia, unspecified: Secondary | ICD-10-CM

## 2022-03-22 DIAGNOSIS — M1990 Unspecified osteoarthritis, unspecified site: Secondary | ICD-10-CM

## 2022-03-22 DIAGNOSIS — R002 Palpitations: Secondary | ICD-10-CM

## 2022-03-22 DIAGNOSIS — M549 Dorsalgia, unspecified: Secondary | ICD-10-CM

## 2022-03-22 DIAGNOSIS — R001 Bradycardia, unspecified: Secondary | ICD-10-CM

## 2022-03-22 DIAGNOSIS — F1010 Alcohol abuse, uncomplicated: Secondary | ICD-10-CM

## 2022-03-22 DIAGNOSIS — R5382 Chronic fatigue, unspecified: Secondary | ICD-10-CM

## 2022-03-22 NOTE — Progress Notes
Date of Service: 03/22/2022    Zachary Manning is a 72 y.o. male.       HPI        Zachary Manning is a 72 y.o. white  male with a history of paroxysmal atrial fibrillation, CTI dependent atrial flutter (previously noted on an EKG performed in 2014, patient did not go RFA, he was treated conservatively with AV node blocking agents and anticoagulation and he did convert to normal sinus rhythm), status post cardioversion in 2002, prostate cancer, osteoarthritis anticipating to receive a cervical spine steroid shot on March 12, history of heavy EtOH use.    On 02/22/2022 patient did experience symptomatic heart palpitations, he reported to the emergency room department at the local hospital in [place], [place] he was found to have atrial fibrillation.  He did convert to normal sinus rhythm.  Patient has been continued for a long time on digoxin, verapamil and flecainide.    He also reports having an episode of symptomatic heart palpitations yesterday, he thinks it lasted few hours and eventually went back to normal sinus rhythm.    He does not have any documented history of structural heart disease does not have any documented history of CAD.       Vitals:    03/22/22 0932   BP: 134/72   BP Source: Arm, Left Upper   Pulse: 72   SpO2: 95%   O2 Device: None (Room air)   PainSc: Zero   Weight: 99.4 kg (219 lb 3.2 oz)   Height: 198.1 cm (6' 6)     Body mass index is 25.33 kg/m?Marland Kitchen     Past Medical History  Patient Active Problem List    Diagnosis Date Noted    S/P radiation therapy 10/29/2020    Prostate cancer (HCC) 09/28/2016       PSA history (up to last 5 years):   11/26/15  PSA  5.7 ng/mL  Free PSA:  0.95   %Free PSA:  16.7% free (per note)  08/23/16  PSA  5.88 ng/mL  Lab Results   Component Value Date/Time    PSA 1.94 07/18/2018 03:13 PM    PSA 0.74 04/26/2018 02:15 PM    PSA 0.67 01/05/2018 10:26 AM    PSA 0.20 10/05/2017 02:10 PM    PSA 4.86 (H) 07/14/2017 01:27 PM       09/28/16 -- Initial visit with Dr. Jimmey Ralph. DRE right firm nodule 8mm. Plan for template biopsy.    10/05/16  Final Diagnosis:     A. Prostate, left medial apex, biopsy:   Benign prostatic glands and stroma.   Negative for high grade PIN or malignancy.       B. Prostate, left medial mid, biopsy:   Benign prostatic glands and stroma.   Negative for high grade PIN or malignancy.       C. Prostate, left medial base, biopsy:   Benign prostatic glands and stroma.   Negative for high grade PIN or malignancy.       D. Prostate, left lateral apex, biopsy:   Benign prostatic glands and stroma.   Negative for high grade PIN or malignancy.         E. Prostate, left lateral mid, biopsy:   Benign prostatic glands and stroma.   Negative for high grade PIN or malignancy.         F. Prostate, left lateral base, biopsy:   Benign prostatic glands and stroma.   Negative for high grade PIN or malignancy.  G. Prostate, right medial apex, biopsy:   Benign prostatic glands and stroma.   Negative for high grade PIN or malignancy.       H. Prostate, right medial mi, biopsy:   Prostatic adenocarcinoma Gleason grade 3+3=score of 6 (Grade group 1) in   1 of one core involving     10% of the needle core tissue and measuring 2 mm in length. See comment     I. Prostate, right medial base, biopsy:   Benign prostatic glands and stroma.   Negative for high grade PIN or malignancy.         J. Prostate, right lateral apex, biopsy:   Benign prostatic glands and stroma.   Negative for high grade PIN or malignancy.       K. Prostate, right lateral mid, biopsy:   Benign prostatic glands and stroma.   Negative for high grade PIN or malignancy.         L. Prostate, right lateral base, biopsy:   Benign prostatic glands and stroma.   Negative for high grade PIN or malignancy.     Mri of prostate:  IMPRESSION    1. Lenticular, moderately T2 hypointense lesion within the anterior   transition zone, mid gland to base, which may represent asymmetric   anterior fibromuscular stroma, though meets the T2 imaging criteria for a   PI-RADS 5 abnormality. Associated broad-based abutment and mild bulging of   the anterior prostate capsule without gross extraprostatic extension. This   lesion was contoured in DynaCAD for potential fusion biopsy.     05/11/2017: Fusion biopsy: 1of 13 cores positive.    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     Lupron injection: 06/16/2017    SBRT with Dr. Flonnie Hailstone, 08/21/2017 through 08/31/2017          Atrial flutter (HCC) 02/11/2014    Poor dentition 10/11/2012    Heart palpitations 10/11/2012    Atrial fibrillation (HCC) 02/02/2011    Hypertension 02/02/2011    Bradycardia 02/02/2011    ETOH abuse 02/02/2011    Fatigue 02/02/2011         Review of Systems   Constitutional: Negative.   HENT: Negative.     Eyes: Negative.    Cardiovascular: Negative.    Respiratory: Negative.     Endocrine: Negative.    Hematologic/Lymphatic: Negative.    Skin: Negative.    Musculoskeletal: Negative.    Gastrointestinal: Negative.    Genitourinary: Negative.    Neurological: Negative.    Psychiatric/Behavioral: Negative.     Allergic/Immunologic: Negative.        Physical Exam  General Appearance: normal in appearance  Skin: warm, moist, no ulcers or xanthomas  Eyes: conjunctivae and lids normal, pupils are equal and round  Lips & Oral Mucosa: no pallor or cyanosis  Neck Veins: neck veins are flat, neck veins are not distended  Chest Inspection: chest is normal in appearance  Respiratory Effort: breathing comfortably, no respiratory distress  Auscultation/Percussion: lungs clear to auscultation, no rales or rhonchi, no wheezing  Cardiac Rhythm: regular rhythm and normal rate  Cardiac Auscultation: S1, S2 normal, no rub, no gallop  Murmurs: no murmur  Carotid Arteries: normal carotid upstroke bilaterally, no bruit  Lower Extremity Edema: no lower extremity edema  Abdominal Exam: soft, non-tender, no masses, bowel sounds normal  Liver & Spleen: no organomegaly  Language and Memory: patient responsive and seems to comprehend information  Neurologic Exam: neurological assessment grossly intact      Cardiovascular Studies  Twelve-lead EKG demonstrates normal sinus rhythm, ventricular rate 63 bpm, no axis deviation.    Cardiovascular Health Factors  Vitals BP Readings from Last 3 Encounters:   03/22/22 134/72   03/01/22 (!) 148/72   08/10/21 126/60     Wt Readings from Last 3 Encounters:   03/22/22 99.4 kg (219 lb 3.2 oz)   03/03/22 102.1 kg (225 lb)   03/01/22 101.4 kg (223 lb 9.6 oz)     BMI Readings from Last 3 Encounters:   03/22/22 25.33 kg/m?   03/03/22 26.68 kg/m?   03/01/22 25.84 kg/m?      Smoking Social History     Tobacco Use   Smoking Status Former   Smokeless Tobacco Current    Types: Chew      Lipid Profile Cholesterol   Date Value Ref Range Status   12/24/2020 150  Final     HDL   Date Value Ref Range Status   12/24/2020 47  Final     LDL   Date Value Ref Range Status   12/24/2020 78  Final     Triglycerides   Date Value Ref Range Status   12/24/2020 128  Final      Blood Sugar No results found for: HGBA1C  Glucose   Date Value Ref Range Status   02/22/2022 96  Final   12/24/2020 96  Final   04/18/2020 87  Final          Problems Addressed Today  Encounter Diagnoses   Name Primary?    Pure hypercholesterolemia Yes    Cardiovascular symptoms     Prostate cancer (HCC)     Atrial fibrillation, unspecified type (HCC)     Chronic fatigue     Primary hypertension     ETOH abuse     Bradycardia     Atrial flutter, unspecified type (HCC)     Poor dentition     Heart palpitations     S/P radiation therapy        Assessment and Plan     Assessment:    1.  Symptomatic heart palpitations due to underlying paroxysmal atrial fibrillation  Patient did have an episode that occurred on 02/22/2022, he was evaluated in the emergency room department at the local hospital, that episode presumably lasted 16-18 hours  Recurrent episode on 03/21/2022, it also lasted few hours and eventually went back to normal sinus rhythm spontaneously  Patient is currently not anticoagulated, however following the last ER visit apixaban was recommended  He continues on AV node blocking agents and flecainide  CHA2DS2-VASc score equal sign 2, yearly risk of stroke is 2.2%  2.  History of CTI dependent atrial flutter  This was detected on a twelve-lead EKG dated 01/22/2013  Patient did convert to NSR spontaneously, he did not undergo RFA  3.  History of anticoagulation with a vitamin K antagonist  This was discontinued in April 2014  4.  Mildly sclerotic aortic valve without significant stenosis or regurgitation  5.  History of prostate cancer, status post XRT, patient reports that currently he is in remission  6.  Osteoarthritis, scheduled to undergo a steroid shot in the cervical spine on April 05, 2022    Plan:    Continue all current medications  Initiate apixaban 5 mg p.o. twice daily  after the patient will undergo the planned steroid shot of the cervical spine and when considered to be safe/minimal risk of bleeding from a neurosurgical standpoint.  Follow-up office visit in approximately 3 months      Total Time Today was 40 minutes in the following activities: Preparing to see the patient, Obtaining and/or reviewing separately obtained history, Performing a medically appropriate examination and/or evaluation, Counseling and educating the patient/family/caregiver, Ordering medications, tests, or procedures, Referring and communication with other health care professionals (when not separately reported), Documenting clinical information in the electronic or other health record, Independently interpreting results (not separately reported) and communicating results to the patient/family/caregiver, and Care coordination (not separately reported)          Current Medications (including today's revisions)   aspirin EC 81 mg tablet Take one tablet by mouth daily.    atorvastatin (LIPITOR) 20 mg tablet Take one tablet by mouth daily. Indications: high cholesterol    digoxin (LANOXIN) 125 mcg (0.125 mg) tablet Take one tablet by mouth daily.    flecainide (TAMBOCOR) 150 mg tablet Take one tablet by mouth twice daily.    gabapentin (NEURONTIN) 300 mg capsule Take one capsule by mouth every 8 hours. Take 1 cap qhs x5d, then 1 cap bid x5d, then 1 cap tid thereafter    LINZESS 145 mcg capsule Take one capsule by mouth daily as needed.    losartan (COZAAR) 100 mg tablet Take one tablet by mouth daily.    verapamil CR (CALAN SR) 120 mg tablet Take one tablet by mouth twice daily.

## 2022-03-25 ENCOUNTER — Encounter: Admit: 2022-03-25 | Discharge: 2022-03-25 | Payer: MEDICARE

## 2022-03-25 DIAGNOSIS — I4891 Unspecified atrial fibrillation: Secondary | ICD-10-CM

## 2022-03-25 MED ORDER — APIXABAN 5 MG PO TAB
5 mg | ORAL_TABLET | Freq: Two times a day (BID) | ORAL | 3 refills | Status: AC
Start: 2022-03-25 — End: ?

## 2022-04-01 ENCOUNTER — Encounter: Admit: 2022-04-01 | Discharge: 2022-04-01 | Payer: MEDICARE

## 2022-04-02 ENCOUNTER — Encounter: Admit: 2022-04-02 | Discharge: 2022-04-02 | Payer: MEDICARE

## 2022-04-02 ENCOUNTER — Ambulatory Visit: Admit: 2022-04-02 | Discharge: 2022-04-02 | Payer: MEDICARE

## 2022-04-02 DIAGNOSIS — M4802 Spinal stenosis, cervical region: Secondary | ICD-10-CM

## 2022-04-02 DIAGNOSIS — M5030 Other cervical disc degeneration, unspecified cervical region: Secondary | ICD-10-CM

## 2022-04-02 DIAGNOSIS — M5412 Radiculopathy, cervical region: Secondary | ICD-10-CM

## 2022-04-06 ENCOUNTER — Encounter: Admit: 2022-04-06 | Discharge: 2022-04-06 | Payer: MEDICARE

## 2022-04-27 ENCOUNTER — Encounter: Admit: 2022-04-27 | Discharge: 2022-04-27 | Payer: MEDICARE

## 2022-04-27 ENCOUNTER — Ambulatory Visit: Admit: 2022-04-27 | Discharge: 2022-04-27 | Payer: MEDICARE

## 2022-04-27 DIAGNOSIS — M1990 Unspecified osteoarthritis, unspecified site: Secondary | ICD-10-CM

## 2022-04-27 DIAGNOSIS — K5900 Constipation, unspecified: Secondary | ICD-10-CM

## 2022-04-27 DIAGNOSIS — M5412 Radiculopathy, cervical region: Secondary | ICD-10-CM

## 2022-04-27 DIAGNOSIS — M2550 Pain in unspecified joint: Secondary | ICD-10-CM

## 2022-04-27 DIAGNOSIS — M4802 Spinal stenosis, cervical region: Secondary | ICD-10-CM

## 2022-04-27 DIAGNOSIS — M5030 Other cervical disc degeneration, unspecified cervical region: Secondary | ICD-10-CM

## 2022-04-27 DIAGNOSIS — I4891 Unspecified atrial fibrillation: Secondary | ICD-10-CM

## 2022-04-27 DIAGNOSIS — M549 Dorsalgia, unspecified: Secondary | ICD-10-CM

## 2022-04-27 DIAGNOSIS — I10 Essential (primary) hypertension: Secondary | ICD-10-CM

## 2022-04-27 MED ORDER — PREGABALIN 75 MG PO CAP
75 mg | ORAL_CAPSULE | Freq: Two times a day (BID) | ORAL | 3 refills | Status: AC
Start: 2022-04-27 — End: ?

## 2022-04-27 NOTE — Progress Notes
[facility] Inter[facility]  Subjective     Chief Complaint:   Chief Complaint   Patient presents with    Middle Back - Pain    Neck - Pain    Follow Up     MRI review          HPI: Zachary Manning is a pleasant 72 y.o. male who is referred for evaluation of pain and  has a past medical history of Arthritis, Atrial fibrillation (HCC), Back pain, Constipation (been battling it for years), Hypertension (02/02/2011), and Joint pain (for  a long time).    He returns after gabapentin trial and imaging. He did not take gabapentin due to concern for arrhythmias. His pain is worst in the neck. There is no numbness/tingling. The pain is aggravated by bending and lifting. He is still working and Magazine features editor but has started identifying opportunities to reduce the amount of lifting he is doing. The pain is improves with rest and medication.  He has been taking meloxicam prescribed by PCP. He has been off of his blood thinner while on meloxicam. He has concerns about epidurals since they do not have FDA approval. He has a list of medication he is interested in possibly trying in place of gabapentin.      PRIOR MEDICATIONS:   Effective  Acetaminophen    Ineffective    Unable to tolerate  NSAID - cardiac hx (a fib on AC), no stents or MI    Never  Gabapentin   Lyrica  Ami/Nortriptyline  Cymbalta  Tizanidine    PRIOR INTERVENTIONS:  Spine surgery:        Effective  Chiropractor - less effective recently    Ineffective  PT    - Anticoagulants:  apixaban  aspirin EC  ,,No         ROS: A 10-point review of systems was negative except as noted above and below.    Past Medical History:  Medical History:   Diagnosis Date    Arthritis     Atrial fibrillation (HCC)     Back pain     Constipation been battling it for years    Hypertension 02/02/2011    Joint pain for  a long time       Family History:  Family History   Problem Relation Age of Onset    Arthritis Mother         grand mother    Heart Attack Father 41 Heart problem Father        Social History:  Lives in [place] [id number]  Social History     Socioeconomic History    Marital status: Divorced   Tobacco Use    Smoking status: Former    Smokeless tobacco: Current     Types: Financial planner    Vaping status: Never Used   Substance and Sexual Activity    Alcohol use: No     Comment: Quit drinking 2 weeks ago.    Drug use: No    Sexual activity: Not Currently       Allergies:  Allergies   Allergen Reactions    Sulfa Dyne RASH    Lisinopril COUGH       Medications:    Current Outpatient Medications:     apixaban (ELIQUIS) 5 mg tablet, Take one tablet by mouth twice daily., Disp: 180 tablet, Rfl: 3    aspirin EC 81 mg tablet, Take one tablet by mouth daily., Disp: ,  Rfl:     atorvastatin (LIPITOR) 20 mg tablet, Take one tablet by mouth daily. Indications: high cholesterol, Disp: 90 tablet, Rfl: 3    diclofenac sodium (SOLARAZE) 3 % gel, TWICE A DAY, Disp: , Rfl:     digoxin (LANOXIN) 125 mcg (0.125 mg) tablet, Take one tablet by mouth daily., Disp: 90 tablet, Rfl: 3    flecainide (TAMBOCOR) 150 mg tablet, Take one tablet by mouth twice daily., Disp: 180 tablet, Rfl: 3    gabapentin (NEURONTIN) 300 mg capsule, Take one capsule by mouth every 8 hours. Take 1 cap qhs x5d, then 1 cap bid x5d, then 1 cap tid thereafter, Disp: 90 capsule, Rfl: 1    LINZESS 145 mcg capsule, Take one capsule by mouth daily as needed., Disp: , Rfl:     losartan (COZAAR) 100 mg tablet, Take one tablet by mouth daily., Disp: 90 tablet, Rfl: 3    meloxicam (MOBIC) 15 mg tablet, Take one tablet by mouth daily., Disp: , Rfl:     pregabalin (LYRICA) 75 mg capsule, Take one capsule by mouth twice daily., Disp: 90 capsule, Rfl: 3    verapamil CR (CALAN SR) 120 mg tablet, Take one tablet by mouth twice daily., Disp: 180 tablet, Rfl: 3    Oswestry Total Score:: 18  No data recorded    Physical examination:   Pulse 116  - Ht 198.1 cm (6' 6)  - Wt 98.4 kg (217 lb)  - SpO2 100%  - BMI 25.08 kg/m? Pain Score: Three    General: Alert, cooperative, no acute distress  HEENT: Normocephalic, atraumatic  Neck: Supple  Lungs: Unlabored respirations  Heart: Regular rate  Skin: Warm and dry to touch  Abdomen: Nondistended    Cervical spine:  Cervical tenderness: Yes  Occipital tenderness: No  Pain with extension: Yes  Pain with lateral flexion: Bilateral  Limited neck ROM: Yes  Sensation to light touch: Intact and equal in the bilateral upper extremities  Strength: 5/5 bilaterally in the flexors and extensors of the bilateral upper extremities  Hoffman sign: Negative bilaterally    Neurological: Alert and oriented x3.    EXAM: MRI C-SPINE WO CONTRAST   CLINICAL INDICATION: 71 years. Neck pain, hx spinal stenosis.   COMPARISON: 03/03/2022, C SPINE AP & LAT.     TECHNIQUE: Multiplanar, multisequence MRI acquisition of the cervical   spine without IV contrast.     FINDINGS: Images are degraded by motion artifact despite patient's best   efforts. No acute fracture or malalignment. Heterogeneous bone marrow   signal with multilevel reactive endplate changes. No focal marrow lesion.   Normal size and signal of the cervical spinal cord. Visualized   intracranial contents are unremarkable. Multilevel cervical spondylosis   with facet and uncovertebral hypertrophy detailed below.     C2-C3: Moderate left and mild right foraminal stenosis.     C3-C4: No stenosis.     C4-C5: Mild degenerative disc disease. Moderate left foraminal stenosis.     C5-C6: Severe degenerative disc disease with posterior disc osteophyte.   Moderate spinal canal stenosis. Severe bilateral foraminal stenosis.     C6-C7: Severe degenerative disc disease with posterior disc osteophyte.   Moderate spinal canal stenosis. Severe bilateral foraminal stenosis.     C7-T1: Trace anterolisthesis of C7. Moderate degenerative disc disease   with posterior disc osteophyte. Moderate spinal canal stenosis. Moderate   bilateral foraminal stenosis.     Severe upper thoracic degenerative disc disease with estimated mild   multilevel  spinal canal stenosis.     IMPRESSION   1. Multilevel spondylosis with moderate spinal canal stenosis at C5-C6, C6-C7, and C7-T1.   2.  Multilevel foraminal stenosis, severe bilaterally at C5-C6 and C6-C7.     Finalized by Kimber Relic, M.D. on 04/03/2022 2:04 PM. Dictated by Kimber Relic, M.D. on 04/03/2022 1:57 PM.     Last Cr and LFT's:  Creatinine   Date Value Ref Range Status   02/22/2022 0.94  Final     AST (SGOT)   Date Value Ref Range Status   02/22/2022 20  Final     ALT (SGPT)   Date Value Ref Range Status   02/22/2022 15  Final     Alk Phosphatase   Date Value Ref Range Status   02/22/2022 74  Final     Total Bilirubin   Date Value Ref Range Status   02/22/2022 1.67 (H) 0.2 - 1.2 Final          Assessment:  Zachary Manning is a 72 y.o. male who presents for evaluation of pain. Based on history, physical exam, and imaging, the pain complaints are most likely due to:    1. Cervical spinal stenosis        2. Cervical radiculopathy        3. DDD (degenerative disc disease), cervical          He has had an adequate trial of rest, exercise, multimodal treatment, and the passage of time without improvement of symptoms. The pain has significant impact on ability to perform ADLs and quality of life.    Plan:  Medication: Trial lyrica  Diagnostics: Interpreted C-spine xrays and MRI  Interventions: None today. Possibly CESI C7-T1 if pain persists. Discussed potential risks and lack of FDA approval due to limited data supporting efficacy    Risks/benefits of all pharmacologic and interventional treatments discussed and questions answered.

## 2022-04-27 NOTE — Patient Instructions
It was nice to see you today. Thank you for choosing to visit our clinic.       Your time is important and if you had to wait today, we do apologize. Our goal is to run exactly on time; however, on occasion, we get behind in clinic due to unexpected patient issues. Thank you for your patience.    General Instructions:  How to reach me: Please send a MyChart message to the [facility] or leave a voicemail on [phone] with Kelsey, RN.   How to get a medication refill: Please use the MyChart Refill request or contact your pharmacy directly to request medication refills. We do not do same day refills on controlled substances.  How to receive your test results: If you have signed up for MyChart, you will receive your test results and messages from me this way. Otherwise, you will get a phone call or letter. If you are expecting results and have not heard from my office within 2 weeks of your testing, please send a MyChart message or call my office.  Scheduling: Our scheduling phone number is [phone].  Appointment Reminders on your cell phone: Communication preferences can be managed in MyChart to ensure you receive important appointment notifications.  Support for many chronic illnesses is available through [facility]: [facility] or [phone].  For questions on nights, weekends or holidays, call the Operator at [phone], and ask for the doctor on call for Anesthesia Pain.      Again, thank you for coming in today.

## 2022-06-09 ENCOUNTER — Encounter: Admit: 2022-06-09 | Discharge: 2022-06-09 | Payer: MEDICARE

## 2022-06-09 MED ORDER — ATORVASTATIN 20 MG PO TAB
ORAL_TABLET | 0 refills
Start: 2022-06-09 — End: ?

## 2022-06-22 ENCOUNTER — Encounter: Admit: 2022-06-22 | Discharge: 2022-06-22 | Payer: MEDICARE

## 2022-06-22 MED ORDER — ATORVASTATIN 20 MG PO TAB
20 mg | ORAL_TABLET | Freq: Every day | ORAL | 3 refills | Status: AC
Start: 2022-06-22 — End: ?

## 2022-07-04 ENCOUNTER — Encounter: Admit: 2022-07-04 | Discharge: 2022-07-04 | Payer: MEDICARE

## 2022-07-04 NOTE — Progress Notes
Records request faxed to [facility] Medical Records @ [phone].

## 2022-07-04 NOTE — Progress Notes
Kyra Leyland    Referral source: Dorris Fetch, MD   Specialty:  Cardiology  Date presented for navigation:  03/29/2022    Primary care physician: Rockwell Germany  Primary cardiologist: Dorris Fetch, MD     He was referred to the atrial fibrillation clinic following recurrent AF on flecainide.   Initial diagnosis of atrial fibrillation (date): unk; prior to 2002    History of atrial fibrillation management  Rate control medications (past and present): digoxin, verapamil  controlled on current regimen  Required DCCV in the past: Yes   2002  Rhythm control medications (past and present): flecainide  Prior ablation(s): No    Cardiovascular Studies   QTc 444  Ambulatory monitor:  7/17-7/30/2023  Echocardiogram: Date:  03/09/2018  LA Size  3.8 cm  (Range: 3.0 - 4.0)  LA Vol 50 mL  (Range: 18 - 58)  LA Vol Index 21.83 mL/m2  (Range: 16 - 34)  EF: 55%  Ischemic evaluation: Date: 03/09/2018

## 2022-07-04 NOTE — Progress Notes
STAT request    Request for the following medical records for purpose of continuity of care:   Has an appointment with Dr. Marya Amsler on 07/07/2022    Zachary Manning  Patient was seen in the Emergency Room on 02/22/2022.  Please send [place] the records from this visit.    Please include...  Discharge Summary  EKGs  Lab results  Any other test results  Any procedure notes    Please Fax to: [phone]  Cardiology Services at the [facility]   Dr. Bradly Bienenstock  Attention:  Amada Kingfisher, RN; Roxy Horseman, RN    Thank you

## 2022-07-07 ENCOUNTER — Encounter: Admit: 2022-07-07 | Discharge: 2022-07-07 | Payer: MEDICARE

## 2022-07-07 ENCOUNTER — Ambulatory Visit: Admit: 2022-07-07 | Discharge: 2022-07-07 | Payer: MEDICARE

## 2022-07-07 DIAGNOSIS — K089 Disorder of teeth and supporting structures, unspecified: Secondary | ICD-10-CM

## 2022-07-07 DIAGNOSIS — I4891 Unspecified atrial fibrillation: Secondary | ICD-10-CM

## 2022-07-07 DIAGNOSIS — I10 Essential (primary) hypertension: Secondary | ICD-10-CM

## 2022-07-07 DIAGNOSIS — M2550 Pain in unspecified joint: Secondary | ICD-10-CM

## 2022-07-07 DIAGNOSIS — M549 Dorsalgia, unspecified: Secondary | ICD-10-CM

## 2022-07-07 DIAGNOSIS — K5900 Constipation, unspecified: Secondary | ICD-10-CM

## 2022-07-07 DIAGNOSIS — M1990 Unspecified osteoarthritis, unspecified site: Secondary | ICD-10-CM

## 2022-07-07 DIAGNOSIS — Z136 Encounter for screening for cardiovascular disorders: Secondary | ICD-10-CM

## 2022-07-07 DIAGNOSIS — I454 Nonspecific intraventricular block: Secondary | ICD-10-CM

## 2022-07-07 MED ORDER — FLECAINIDE 100 MG PO TAB
100 mg | ORAL_TABLET | Freq: Two times a day (BID) | ORAL | 1 refills | 30.00000 days | Status: AC
Start: 2022-07-07 — End: ?

## 2022-07-08 ENCOUNTER — Encounter: Admit: 2022-07-08 | Discharge: 2022-07-08 | Payer: MEDICARE

## 2022-07-08 DIAGNOSIS — I4891 Unspecified atrial fibrillation: Secondary | ICD-10-CM

## 2022-07-08 DIAGNOSIS — M1990 Unspecified osteoarthritis, unspecified site: Secondary | ICD-10-CM

## 2022-07-08 DIAGNOSIS — I10 Essential (primary) hypertension: Secondary | ICD-10-CM

## 2022-07-08 DIAGNOSIS — M2550 Pain in unspecified joint: Secondary | ICD-10-CM

## 2022-07-08 DIAGNOSIS — K5900 Constipation, unspecified: Secondary | ICD-10-CM

## 2022-07-08 DIAGNOSIS — M549 Dorsalgia, unspecified: Secondary | ICD-10-CM

## 2022-08-02 ENCOUNTER — Encounter: Admit: 2022-08-02 | Discharge: 2022-08-02 | Payer: MEDICARE

## 2022-08-02 MED ORDER — LOSARTAN 100 MG PO TAB
100 mg | ORAL_TABLET | Freq: Every day | ORAL | 0 refills
Start: 2022-08-02 — End: ?

## 2022-08-29 ENCOUNTER — Encounter: Admit: 2022-08-29 | Discharge: 2022-08-29 | Payer: MEDICARE

## 2022-08-30 ENCOUNTER — Encounter: Admit: 2022-08-30 | Discharge: 2022-08-30 | Payer: MEDICARE

## 2022-08-31 ENCOUNTER — Encounter: Admit: 2022-08-31 | Discharge: 2022-08-31 | Payer: MEDICARE

## 2022-09-09 ENCOUNTER — Encounter: Admit: 2022-09-09 | Discharge: 2022-09-09 | Payer: MEDICARE

## 2022-09-09 MED ORDER — VERAPAMIL 120 MG PO TBER
120 mg | ORAL_TABLET | Freq: Two times a day (BID) | ORAL | 3 refills | 90.00000 days | Status: AC
Start: 2022-09-09 — End: ?

## 2022-09-28 ENCOUNTER — Encounter: Admit: 2022-09-28 | Discharge: 2022-09-28 | Payer: MEDICARE

## 2022-10-03 ENCOUNTER — Encounter: Admit: 2022-10-03 | Discharge: 2022-10-03 | Payer: MEDICARE

## 2022-10-03 NOTE — Telephone Encounter
Zachary Manning called into the nursing line requesting approval to hold Eliquis for upcoming epidural injection.  Discussed with Dr. Avie Arenas, okay to hold Eliquis for up to 3 days prior to injection.  Notified pt of instructions.  He is agreeable to plan. Will callback with any questions, concerns or problems.

## 2022-10-06 ENCOUNTER — Encounter: Admit: 2022-10-06 | Discharge: 2022-10-06 | Payer: MEDICARE

## 2022-10-06 NOTE — Telephone Encounter
Spoke with patient regarding his Doxycycline and Tessalon Perles prescriptions that were written on 09/25/2022. Pt states he has had a persistent cough and only filled the Tessalon and was unaware of the abx Rx so he did not fill that. Symptoms have not resolved. Denies fever/ chills. Advised pt to contact [facility] to confirm prescription as well as prescribing provider for further instruction. And then contact us at [phone] to confirm a plan for upcoming procedure on Wed 10/12/2022 @ 1500.  Pt verbalized an understanding of the plan to be able to move forward with procedure or potential reschedule r/t infection. Will continue to check in and monitor patient over the next week if s/s have resolved.  Until updated plan, pt instructed to hold Eliquis starting Sunday 10/09/2022

## 2022-10-10 ENCOUNTER — Encounter: Admit: 2022-10-10 | Discharge: 2022-10-10 | Payer: MEDICARE

## 2022-10-10 NOTE — Telephone Encounter
Patient returned telephone call from last Wednesday re: pre-visit planning. Per patient, he is currently ill, on antibiotics, and has not held his blood thinner as directed. Patient would like to cancel his appointment at this time, will reschedule when he is feeling better.

## 2022-10-12 ENCOUNTER — Encounter: Admit: 2022-10-12 | Discharge: 2022-10-12 | Payer: MEDICARE

## 2022-10-16 ENCOUNTER — Encounter: Admit: 2022-10-16 | Discharge: 2022-10-16 | Payer: MEDICARE

## 2022-10-16 MED ORDER — DIGOXIN 125 MCG (0.125 MG) PO TAB
125 ug | ORAL_TABLET | Freq: Every day | ORAL | 0 refills
Start: 2022-10-16 — End: ?

## 2022-11-14 IMAGING — US AAASCREEN
1 series · 14 of 25 positions shown · non-contrast
Comparison: none

[Series 1: us aaa screening · 14 of 33 slices shown]
[im 1/33]
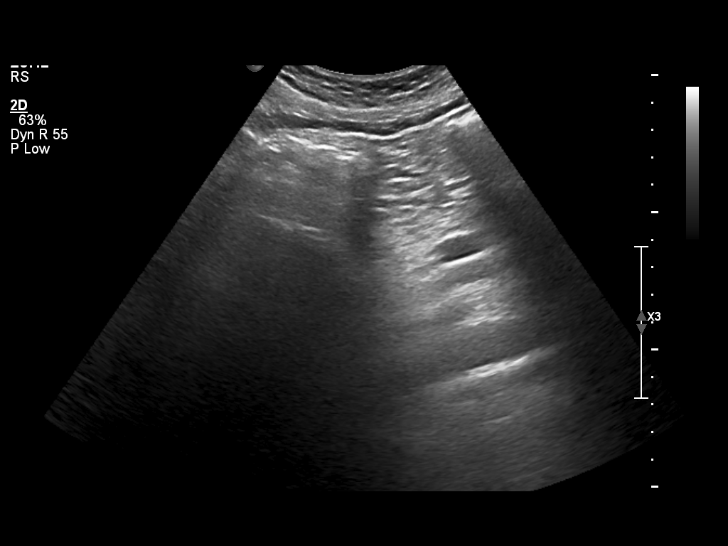
[im 3/33]
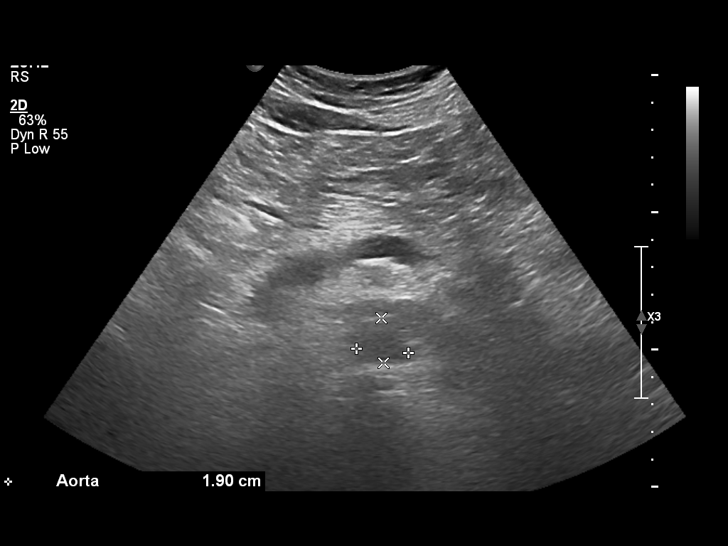
[im 6/33]
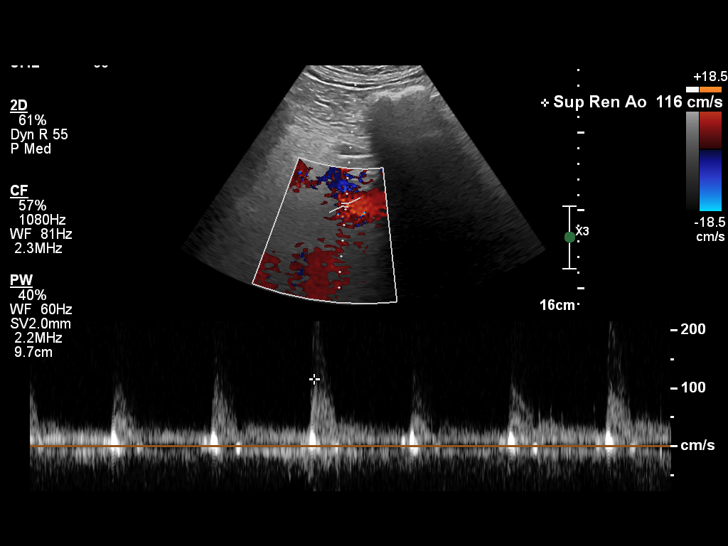
[im 9/33]
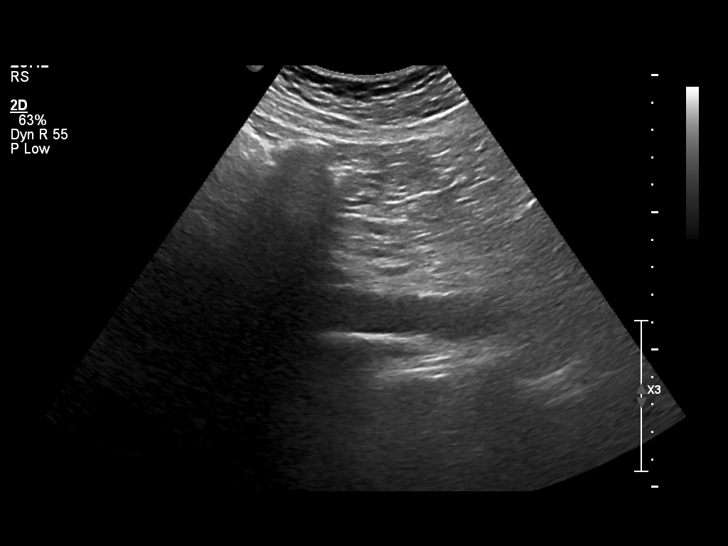
[im 11/33]
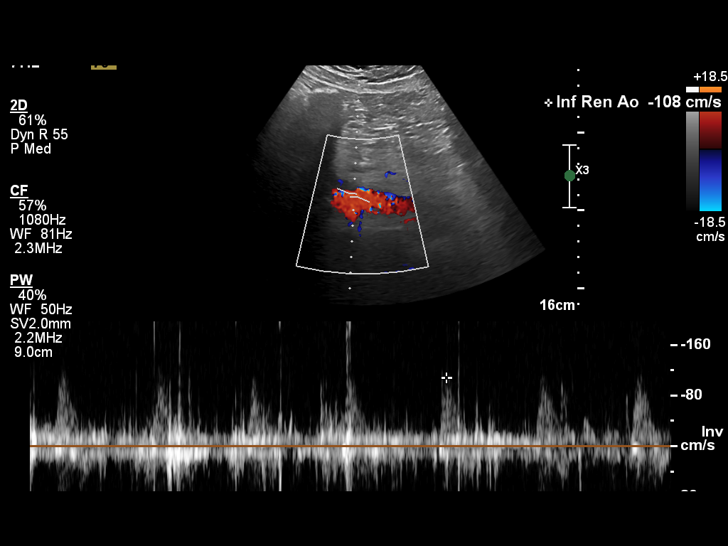
[im 13/33]
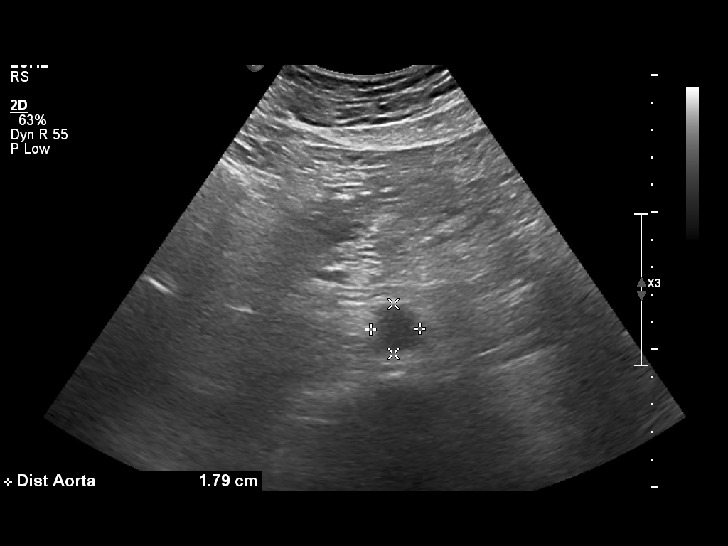
[im 15/33]
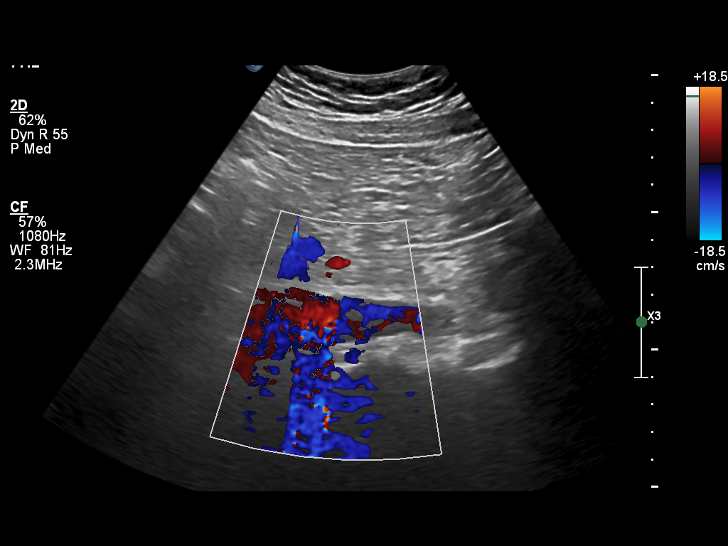
[im 18/33]
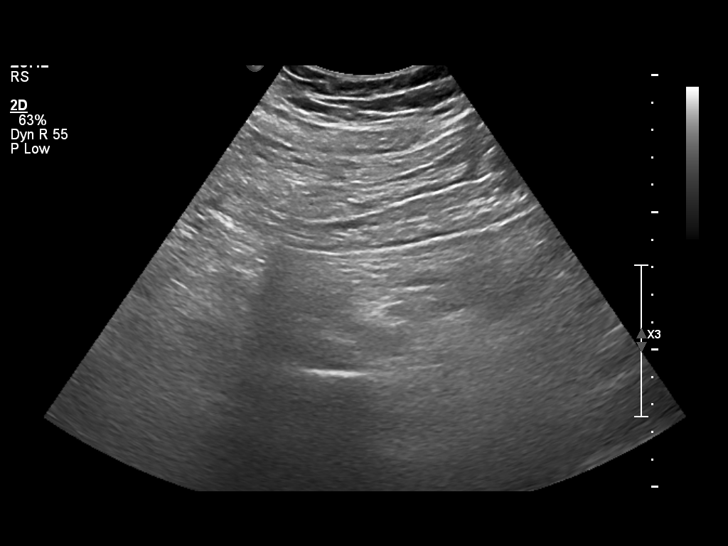
[im 21/33]
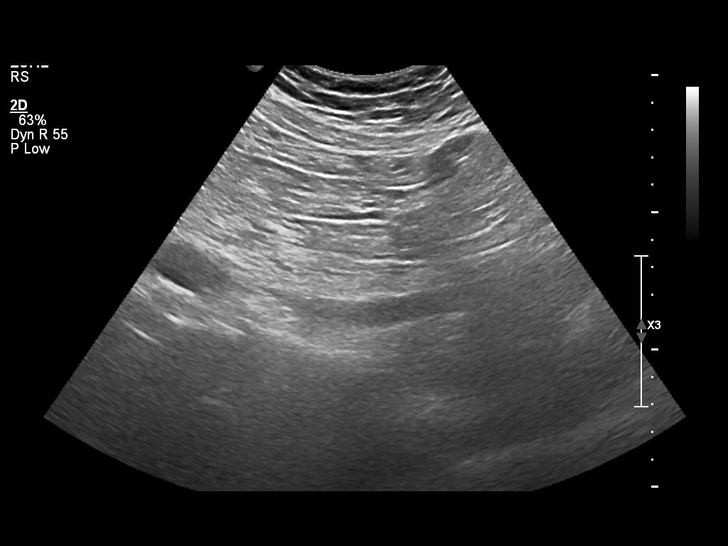
[im 22/33]
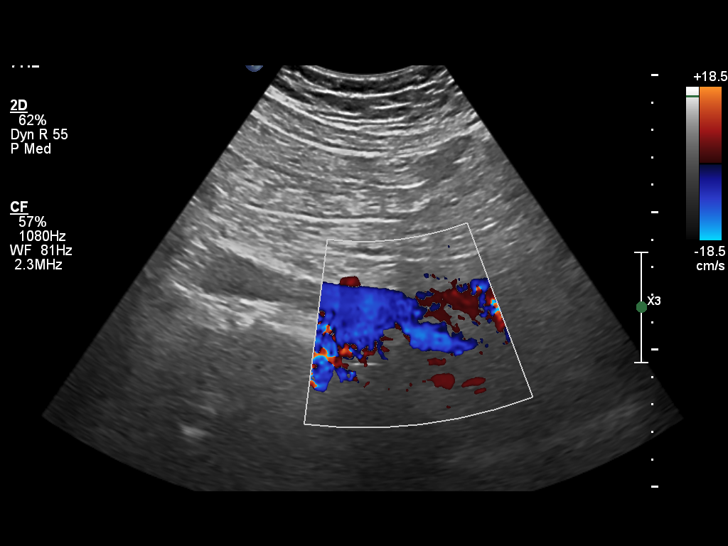
[im 25/33]
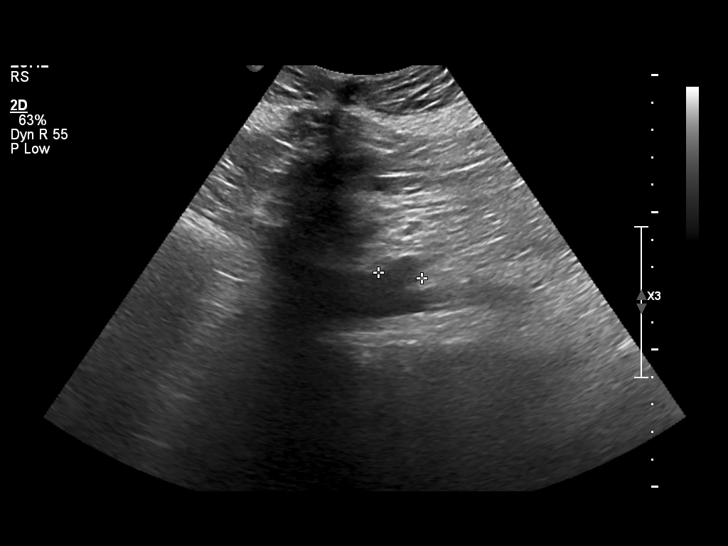
[im 27/33]
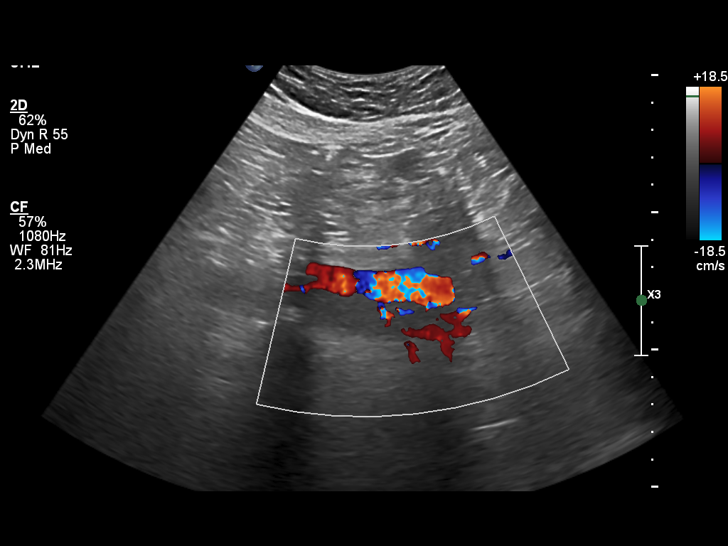
[im 30/33]
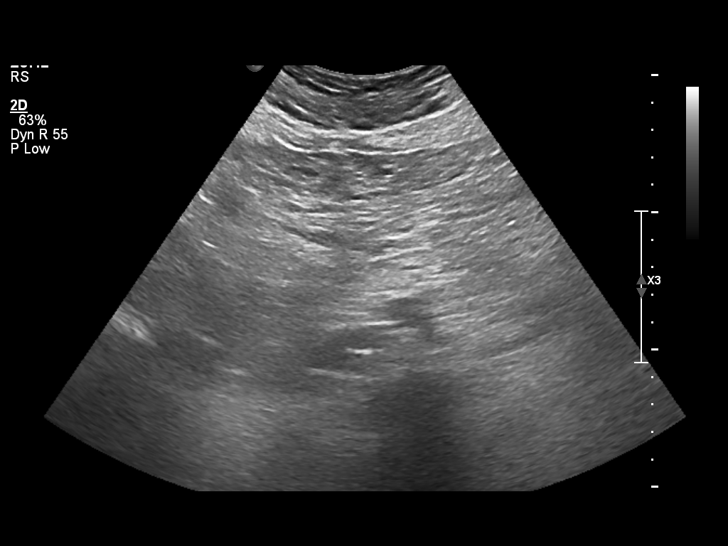
[im 33/33]
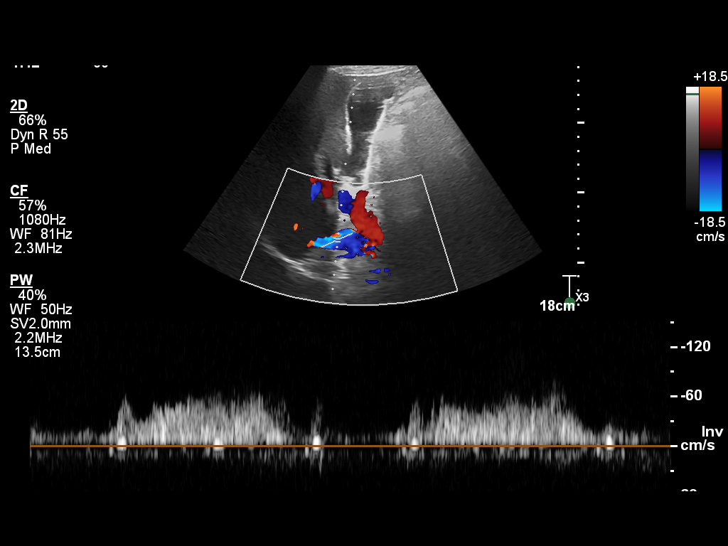

[14 of 25 positions shown; findings below may reference images not displayed]

DIAGNOSTIC STUDIES

EXAM

Abdominal aortic ultrasound

INDICATION

screen aaa

TECHNIQUE

Standard abdominal aortic ultrasound was obtained.

COMPARISONS

None available

FINDINGS

Abdominal aorta is normal in caliber with no evidence for aneurysmal enlargement.

IMPRESSION

No evidence for aneurysmal enlargement of the abdominal aorta.

Tech Notes:

## 2022-12-26 ENCOUNTER — Encounter: Admit: 2022-12-26 | Discharge: 2022-12-26 | Payer: MEDICARE

## 2022-12-26 NOTE — Telephone Encounter
-----   Message from [facility] B sent at 12/26/2022  1:57 PM CST -----  Regarding: RCP- med ?  VM on triage line from patient at 1:23pm.  York Spaniel that he is having a terrible time with Arthritis and anything he wants to take for it has blood thinners in them.  Can he go off Eliquis and will still get some blood thinners in the new med.  Call him at #[phone].

## 2022-12-26 NOTE — Telephone Encounter
Patient states that he is having trouble finding medication that is compatible that he can take to treat his arthritis while he is on eliquis. He is wanting to either stop eliquis or find something that he can take while still taking the eliquis. I helped to provide education to patient about how eliquis works, what he takes it for, and why its important. Patient states understanding of education. Patient has appointment with RCP on 12/16 and he is going to talk to her more at that time about risk/benefit of eliquis and other arthritis medications. Reviewed plan with the patient. Patient verbalized understanding and does not have any further questions or concerns. No further education requested from patient. Patient has our contact information for future needs.

## 2022-12-28 ENCOUNTER — Encounter: Admit: 2022-12-28 | Discharge: 2022-12-28 | Payer: MEDICARE

## 2022-12-28 MED ORDER — FLECAINIDE 100 MG PO TAB
100 mg | ORAL_TABLET | Freq: Two times a day (BID) | ORAL | 1 refills | 30.00000 days | Status: AC
Start: 2022-12-28 — End: ?

## 2023-01-09 ENCOUNTER — Encounter: Admit: 2023-01-09 | Discharge: 2023-01-09 | Payer: MEDICARE

## 2023-01-09 ENCOUNTER — Ambulatory Visit: Admit: 2023-01-09 | Discharge: 2023-01-10 | Payer: MEDICARE

## 2023-01-09 DIAGNOSIS — I10 Essential (primary) hypertension: Secondary | ICD-10-CM

## 2023-01-09 DIAGNOSIS — R0989 Other specified symptoms and signs involving the circulatory and respiratory systems: Secondary | ICD-10-CM

## 2023-01-09 DIAGNOSIS — R001 Bradycardia, unspecified: Secondary | ICD-10-CM

## 2023-01-09 DIAGNOSIS — I4891 Unspecified atrial fibrillation: Secondary | ICD-10-CM

## 2023-01-09 DIAGNOSIS — I480 Paroxysmal atrial fibrillation: Secondary | ICD-10-CM

## 2023-01-09 NOTE — Assessment & Plan Note
Blood pressure in the office today is well-controlled.  I have not made any changes to his medical regimen.

## 2023-01-09 NOTE — Telephone Encounter
30 day external monitor must be worn and resulted prior to scheduling ilr implant. Will reach out to ordering provider and defer for now.

## 2023-01-09 NOTE — Progress Notes
Date of Service: 01/09/2023    Zachary Manning is a 72 y.o. male.       HPI     Zachary Manning presents to my office today in electrophysiology follow-up for history of atrial arrhythmias.  As you know, he is a pleasant 72 year old male with a past medical history of paroxysmal atrial fibrillation with recurrent tachypalpitations, hypertension, and previous alcohol abuse who I initially met in the office in June as part of our atrial fibrillation clinic.  At that time, he reported a several year history of atrial fibrillation as well as a strong family history of the arrhythmia.  He was on 150 mg twice daily of flecainide and still complaining of brief fleeting palpitations.  Patient was on anticoagulation with Eliquis 5 mg twice daily.  His twelve-lead EKG showed QRS widening and I ended up recommending we drop flecainide to 100 mg twice a day.    Zachary Manning returns to my office today, telling me that he has been feeling really well.  Patient denies chest discomfort, shortness of breath, dizziness, or syncope.  He continues to have occasional palpitations which he describes as funny beats about once a month.  During those times, he will take an extra half dose of flecainide to help with his symptoms.  Otherwise, he stopped chewing tobacco but now has developed a cough.  He is working closely with his primary care provider regarding this.         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 Jimmey Ralph. DRE right firm nodule 8mm. Plan for template biopsy.    10/05/16  Final Diagnosis:     A. Prostate, left medial apex, biopsy:   Benign prostatic glands and stroma.   Negative for high grade PIN or malignancy.       B. Prostate, left medial mid, biopsy:   Benign prostatic glands and stroma.   Negative for high grade PIN or malignancy.       C. Prostate, left medial base, biopsy:   Benign prostatic glands and stroma.   Negative for high grade PIN or malignancy.       D. Prostate, left lateral apex, biopsy:   Benign prostatic glands and stroma.   Negative for high grade PIN or malignancy.         E. Prostate, left lateral mid, biopsy:  Benign prostatic glands and stroma.   Negative for high grade PIN or malignancy.         F. Prostate, left lateral base, biopsy:   Benign prostatic glands and stroma.   Negative for high grade PIN or malignancy.         G. Prostate, right medial apex, biopsy:   Benign prostatic glands and stroma.   Negative for high grade PIN or malignancy.       H. Prostate, right medial mi, biopsy:   Prostatic adenocarcinoma Gleason grade 3+3=score of 6 (Grade group 1) in   1 of one core involving     10% of the needle core tissue and measuring 2 mm in length. See comment     I. Prostate, right medial base, biopsy:   Benign prostatic glands and stroma.   Negative for high grade PIN or malignancy.         J. Prostate, right lateral apex, biopsy:   Benign prostatic glands and stroma.   Negative for high grade PIN or malignancy.       K. Prostate, right lateral mid, biopsy:   Benign prostatic glands and stroma.   Negative for high grade PIN or malignancy.         L. Prostate, right lateral base, biopsy:   Benign prostatic glands and stroma.   Negative for high grade PIN or malignancy.     Mri of prostate:  IMPRESSION    1. Lenticular, moderately T2 hypointense lesion within the anterior   transition zone, mid gland to base, which may represent asymmetric   anterior fibromuscular stroma, though meets the T2 imaging criteria for a   PI-RADS 5 abnormality. Associated broad-based abutment and mild bulging of   the anterior prostate capsule without gross extraprostatic extension. This   lesion was contoured in DynaCAD for potential fusion biopsy.     05/11/2017: Fusion biopsy: 1of 13 cores positive.    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     Lupron injection: 06/16/2017    SBRT with Dr. Flonnie Hailstone, 08/21/2017 through 08/31/2017          Atrial flutter (HCC) 02/11/2014    Poor dentition 10/11/2012    Heart palpitations 10/11/2012    Atrial fibrillation (HCC) 02/02/2011     12/02/2015-ECHO-Normal LV size and systolic function. Estimated LV EF of 55%. Normal LV diastolic function.. Normal RV size and function. Valves appear structurally unremarkable for age. Trace MR, mild TR, trace PI. Normal estimated peak systolic PA pressure of 25 mmHg.   No obvious mass, vegetation or thrombus. No pericardial effusion.  03/09/2018 ECHO-No regional wall motion abnormalities are seen. Overall LV systolic function appears normal. The estimated left ventricular ejection fraction is 55%. Left ventricular contractility appears similar when compared with the prior echocardiogram performed on 04/01/11.  Normal left ventricular diastolic function. Right ventricular contractility appears normal.Normal chamber dimensions.Mild focal aortic valve sclerosis without stenosis.No pericardial effusion is seen.  03/09/2018 TML MPI-EF 64%, This study is probably normal with no evidence of significant myocardial ischemia. Mild soft tissue attenuation inferiorly that does not meet statistical significance for ischemia. Left ventricular systolic function is normal. There are no high risk prognostic indicators present. The exercise ECG is negative for ischemia. The patient demonstrated good exercise capacity with a normal heart rate and blood pressure response to exercise.  08/05/2021 Zio XT-Predominant rhythm normal sinus.  Brief episodes of SVT, these were rare occurrences, the longest run lasted 18 beats. Isolated supraventricular ectopy representing less than 1% of the total number of beats, isolated ventricular ectopy representing less than 1% of the total number of beats, no bigeminy and no trigeminy, no VT was present.  No evidence of atrial fibrillation, atrial flutter did not occur, no high degree AV block.        Hypertension 02/02/2011    Bradycardia 02/02/2011    ETOH abuse 02/02/2011    Fatigue 02/02/2011         Review of Systems   Constitutional: Negative.   HENT: Negative.     Eyes: Negative.    Cardiovascular: Negative.    Respiratory: Negative.     Endocrine: Negative.    Hematologic/Lymphatic: Negative.    Skin: Negative.    Musculoskeletal: Negative.    Gastrointestinal: Negative.    Genitourinary: Negative.    Neurological: Negative.    Psychiatric/Behavioral: Negative.     Allergic/Immunologic: Negative.        Physical Exam  GEN: well developed, well nourished, in no acute distress  HEENT: unremarkable, no JVD  CHEST: clear to auscultation bilaterally  CV: Reg rhythm, nml rate; nml S1 & S2, no S3 or S4; no rub; no murmurs  ABD: soft, nontender, non-distended, positive bowel sounds  EXT: no clubbing, cyanosis, or edema, 1+ distal pulses  NEURO: alert and oriented x3, no focal deficits      Cardiovascular Studies  12 lead EKG:  Sinus rhythm, ventricular rate 59 bpm, QT 422 msec, QRSd 114 msec      Cardiovascular Health Factors  Vitals BP Readings from Last 3 Encounters:   01/09/23 120/66   07/07/22 134/66   03/22/22 134/72     Wt Readings from Last 3 Encounters:   01/09/23 96 kg (211 lb 9.6 oz)   07/07/22 96.2 kg (212 lb)   04/27/22 98.4 kg (217 lb)     BMI Readings from Last 3 Encounters:   01/09/23 25.09 kg/m?   07/07/22 24.50 kg/m?   04/27/22 25.08 kg/m?      Smoking Social History     Tobacco Use   Smoking Status Former   Smokeless Tobacco Current    Types: Chew      Lipid Profile Cholesterol   Date Value Ref Range Status   12/24/2020 150  Final     HDL   Date Value Ref Range Status   12/24/2020 47  Final     LDL   Date Value Ref Range Status   12/24/2020 78  Final     Triglycerides   Date Value Ref Range Status   12/24/2020 128  Final      Blood Sugar No results found for: HGBA1C  Glucose   Date Value Ref Range Status   02/22/2022 96  Final   12/24/2020 96  Final   04/18/2020 87  Final          Problems Addressed Today  Encounter Diagnoses   Name Primary?    Paroxysmal atrial fibrillation (HCC) Yes    Bradycardia     Atrial fibrillation and flutter [facility])     Cardiovascular symptoms     Primary hypertension        Assessment and Plan       Atrial fibrillation [facility])  Patient has done quite well over the last 6 months on lower doses of flecainide.  He has an occasional funny  beat about once a month for which she takes an extra half tablet of flecainide.    I have reviewed the results of his EKG with him.  His QRS duration has narrowed considerably.  We talked about further management of his atrial arrhythmias.  Unfortunately, Mr. 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  He is requesting any alternative medications for thromboembolic prophylaxis.    We did talk about his personal risk for stroke in view of his history of atrial fibrillation.  His CHA2DS2-VASc score is 2 which calculates to an approximately 2.2% annual risk of stroke associated with atrial fibrillation.  On anticoagulation, this risk of stroke decreases to approximately 1.1%.  Given these risks, Mr. Sheeks feels comfortable getting off of anticoagulation so he can take medications for his arthritis.  He was agreeable to starting aspirin 81 mg a day.    We also talked about monitoring his atrial fibrillation closely.  I have suggested that he get an ILR given the prevalence of his funny beats.  He has agreed.    Finally, we talked about the importance of getting his stress test done.  He did not have it completed in June.  He is on a 1C antiarrhythmic agent.  He is going to get exercise thallium stress testing performed in the near future.    Hypertension  Blood pressure in the office today is well-controlled.  I have not made any changes to his medical regimen.    I have asked the patient to see me back in follow up in one year.      I spent over  >40 minutes today for the visit including some or all of the following: preparing to see the patient, history/exam, placing orders, documenting the visit, communicating with care team, interpreting tests, and care coordination/communication.      Current Medications (including today's revisions)   aspirin EC 81 mg tablet Take one tablet by mouth daily.    atorvastatin (LIPITOR) 20 mg tablet Take one tablet by mouth daily.    diclofenac sodium (SOLARAZE) 3 % gel TWICE A DAY (Patient not taking: Reported on 01/09/2023)    digoxin (LANOXIN) 125 mcg (0.125 mg) tablet Take 1 tablet by mouth once daily    flecainide (TAMBOCOR) 100 mg tablet Take 1 tablet by mouth twice daily    gabapentin (NEURONTIN) 300 mg capsule Take one capsule by mouth every 8 hours. Take 1 cap qhs x5d, then 1 cap bid x5d, then 1 cap tid thereafter (Patient not taking: Reported on 01/09/2023)    LINZESS 145 mcg capsule Take one capsule by mouth daily as needed.    losartan (COZAAR) 100 mg tablet Take 1 tablet by mouth once daily    meloxicam (MOBIC) 15 mg tablet Take one tablet by mouth daily. (Patient not taking: Reported on 01/09/2023)    pregabalin (LYRICA) 75 mg capsule Take one capsule by mouth twice daily. (Patient not taking: Reported on 01/09/2023)    verapamil CR (CALAN SR) 120 mg tablet Take 1 tablet by mouth twice daily

## 2023-01-09 NOTE — Assessment & Plan Note
Patient has done quite well over the last 6 months on lower doses of flecainide.  He has an occasional funny beat about once a month for which she takes an extra half tablet of flecainide.    I have reviewed the results of his EKG with him.  His QRS duration has narrowed considerably.  We talked about further management of his atrial arrhythmias.  Unfortunately, Zachary Manning has significant osteoarthritis and is unable to take nonsteroidals secondary to concomitant use of anticoagulation.  He is requesting any alternative medications for thromboembolic prophylaxis.    We did talk about his personal risk for stroke in view of his history of atrial fibrillation.  His CHA2DS2-VASc score is 2 which calculates to an approximately 2.2% annual risk of stroke associated with atrial fibrillation.  On anticoagulation, this risk of stroke decreases to approximately 1.1%.  Given these risks, Zachary Manning feels comfortable getting off of anticoagulation so he can take medications for his arthritis.  He was agreeable to starting aspirin 81 mg a day.    We also talked about monitoring his atrial fibrillation closely.  I have suggested that he get an ILR given the prevalence of his funny beats.  He has agreed.    Finally, we talked about the importance of getting his stress test done.  He did not have it completed in June.  He is on a 1C antiarrhythmic agent.  He is going to get exercise thallium stress testing performed in the near future.

## 2023-01-09 NOTE — Patient Instructions
Stop taking eliquis.    Please schedule an appointment with a nurse practitioner in 6 months.  To schedule an appointment call [phone].     In order to provide you the best care possible we ask that you follow up as below:    For non-urgent questions please contact us through your MyChart account.   For all medication refills please contact your pharmacy or send a request through MyChart.     For all questions that may need to be addressed urgently please call the nursing triage voicemail at [phone] Monday - Friday 8-5 only. Please leave a detailed message with your name, date of birth, and reason for your call.      Please allow ~ 10 business days for the results of any testing to be reviewed. Please call our office if you have not heard from a nurse within this time frame.

## 2023-01-10 ENCOUNTER — Ambulatory Visit: Admit: 2023-01-10 | Discharge: 2023-01-11 | Payer: MEDICARE

## 2023-01-10 ENCOUNTER — Encounter: Admit: 2023-01-10 | Discharge: 2023-01-10 | Payer: MEDICARE

## 2023-01-10 NOTE — Progress Notes
To our valued patient,     We have enrolled your heart monitor and requested it to be mailed to your home.  You should receive this within 4 - 6 business days. Please wear the monitor for 30 days. When you have completed the study, please remove the device, and mail it back to the company. Please call Customer Service at [phone] (option 1, 1, 1) with questions about placement, troubleshooting, and insurance coverage. You can reach the ambulatory heart monitor team at [phone].        Please write your NAME, PHYSICIAN, START DATE/TIME on the diary.    Prep skin by shaving and ensuring there is no lotion on the chest.  Gently abrade for clear ecgs.  Showering is okay, but best to shower with back to the water.    Please use the diary for symptoms - specific date and times and what you are feeling.  Please return the device in the mailer with diary promptly after completing the study.        Your Heart Rhythm Management Team  Cardiovascular Medicine Department at [facility]          Ambulatory (External) Cardiac Monitor Enrollment Record     Placement Location: Home Enrollment  Vendor: [facility] Cardiac Telemetry (MCOT/MCT)?: Yes  Duration of Monitor (in days): 30  Monitor Diagnosis: Bradycardia (R00.1)  Secondary Monitor Diagnosis: Palpitations (R00.2)  Ordering Provider: Marya Amsler  AMB Monitor Serial Number: Home Enrollment  No data recorded    Start Time and Date: 01/10/23 9:39 AM   Patient Name: Zachary Manning  DOB: [birth year]  MRN: [id number]  Sex: male  Mobile Phone Number: [phone] (mobile)  Home Phone Number: [phone]  Patient Address: [address] [place] [address]  Insurance Coverage: [facility] MEDICARE REPLACEMENT - LIFE1  Insurance ID: [id number]  Insurance Group #: [id number]  Insurance Subscriber: Bucknam,Rucker ALAN  Implanted Cardiac Device Information: No results found for: EPDEVTYP      Patient instructed to contact company phone number on the monitor box with questions regarding billing, placement, troubleshooting.     Khaya Theissen    ____________________________________________________________    Clinic Staff:    Complete additional steps for documentation double check/Co-Sign.  In Follow-up, send chart upon closing encounter to P CVM HRM AMBULATORY MONITORS    HRM Ambulatory Monitoring Team:  Schedule on appropriate template and check-in.   Clinic Placement Schedule on clinic location [facility] schedule   Home Enrollment Schedule on Home Enrollment schedule ([facility] RHYTHM)   Given to patient in clinic for self-placement Schedule on Home Enrollment schedule ([facility] RHYTHM)   Inpatient Schedule on  [facility] AMBULATORY MONITORING template   2. Please enroll with appropriate vendor.

## 2023-02-03 ENCOUNTER — Encounter: Admit: 2023-02-03 | Discharge: 2023-02-03 | Payer: MEDICARE

## 2023-02-03 MED ORDER — APIXABAN 5 MG PO TAB
5 mg | Freq: Two times a day (BID) | ORAL | 0 refills | Status: AC
Start: 2023-02-03 — End: ?

## 2023-02-03 NOTE — Telephone Encounter
-----   Message from [place] B sent at 02/03/2023 12:52 PM CST -----  Regarding: RCP- in AF  VM on triage line from patient at 12:17pm.  He went into AF 1 1/2 hours ago.   He is taking his medications.  Call him at #[phone].

## 2023-02-03 NOTE — Telephone Encounter
Returned call to patient. States he has been in AF since about 10am. Patient states that since he last saw RCP he put himself back on eliquis 5mg  BID. He states he restarted this about 2-3 weeks ago and has not missed any doses. Patient states he is compliant with his flecainide as well. He denies any changes to his history or his medications. He did state that he may be dehydrated. He is going to try to increase his fluid intake.     He denies CP, SOA or dizziness at this time. He states he is due for his next flec dosing at 3pm. Will route to RCP for further recs      Of note he declined to wear his 30 day montor and states he is no longer interested in an ILR at this time.

## 2023-02-03 NOTE — Telephone Encounter
Kathreen Cornfield, MD  Cvm Nurse Ep Team A17 minutes ago (3:42 PM)       Call him on Monday and see if he is still in fib.  If still in fib, get cardioversion and consider him for tikosyn vs. AF ablation.  See what he says.  I don't think he should go up on flecainide again.     You  Pimentel, Prudencio Burly C, MD1 hour ago (2:29 PM)     SP  Patient called to let us know he is in af. Denies cp, soa or dizziness. He states he started himself back on eliquis 5mg  bid 2-3 weeks ago. He also states he is compliant with his flecainide dosing 100mg  BID. He does not have a way to check his HR or BP at this time. Further recs for him?  Also of note he did not complete his 30day monitor as he states he is no longer interested in an ILR at this time. thanks       Patient notified of rec and that we would talk to him on Monday. He will stay on his meds over the weekend. Patient instructed to go to ED for CP, SOA or dizziness. He is agreeable. Reviewed plan with the patient. Patient verbalized understanding and does not have any further questions or concerns. No further education requested from patient. Patient has our contact information for future needs.       Patient confirmed that he stopped meloxicam and aspirin 81mg  when he started back on eliquis

## 2023-02-09 ENCOUNTER — Encounter: Admit: 2023-02-09 | Discharge: 2023-02-09 | Payer: MEDICARE

## 2023-02-09 NOTE — Telephone Encounter
-----   Message from [facility] B sent at 02/09/2023  8:02 AM CST -----  Regarding: RCP- SOA  VM on triage line from patient at 10:12pm last night.  Said that he is SOA.   Call him at #[phone].

## 2023-02-09 NOTE — Telephone Encounter
Zachary Cornfield, MD  P Cvm Nurse Ep Team A  Caller: Unspecified (Today,  9:54 AM)  [name] might be in heart failure?  He had a cough when I saw him last.  He could also see his PCP      Informed patient of RCP recs.   Patient informed to call scheduling to schedule stress test and to f/u with PCP as well.   Reviewed plan with the patient. Patient verbalized understanding and does not have any further questions or concerns. No further education requested from patient. Patient has our contact information for future needs.

## 2023-02-09 NOTE — Telephone Encounter
RN RC to patient. Patient states he has been SOA for 1 week. He is SOA with activity and when he lays down. He also c/o fatigue due to not being able to sleep. He will occasionally feel palpitations during the day but does not think he is in afib. He denies chest pain, swelling, or weight gain. He has not checked his BP or HR.     RN reviewed chart, patient is due for stress test that was ordered at last OV on 12/16 with RCP. Patient informed to call scheduling to schedule stress test. Patient informed if symptoms worsen to go to the ER. Reviewed plan with the patient. Patient verbalized understanding and does not have any further questions or concerns. No further education requested from patient. Patient has our contact information for future needs.

## 2023-03-08 ENCOUNTER — Encounter: Admit: 2023-03-08 | Discharge: 2023-03-08 | Payer: MEDICARE

## 2023-03-29 ENCOUNTER — Encounter: Admit: 2023-03-29 | Discharge: 2023-03-29 | Payer: MEDICARE

## 2023-06-05 ENCOUNTER — Encounter: Admit: 2023-06-05 | Discharge: 2023-06-05 | Payer: MEDICARE

## 2023-06-05 MED ORDER — VERAPAMIL 120 MG PO TBER
120 mg | ORAL_TABLET | Freq: Two times a day (BID) | ORAL | 0 refills | 90.00000 days | Status: DC
Start: 2023-06-05 — End: 2023-06-05

## 2023-06-05 MED ORDER — VERAPAMIL 120 MG PO TBER
120 mg | ORAL_TABLET | Freq: Two times a day (BID) | ORAL | 0 refills | 90.00000 days | Status: AC
Start: 2023-06-05 — End: ?

## 2023-06-12 ENCOUNTER — Encounter: Admit: 2023-06-12 | Discharge: 2023-06-12 | Payer: MEDICARE

## 2023-06-12 MED ORDER — FLECAINIDE 100 MG PO TAB
100 mg | ORAL_TABLET | Freq: Two times a day (BID) | ORAL | 2 refills | 30.00000 days | Status: AC
Start: 2023-06-12 — End: ?

## 2023-07-20 ENCOUNTER — Encounter: Admit: 2023-07-20 | Discharge: 2023-07-20 | Payer: MEDICARE

## 2023-07-26 ENCOUNTER — Encounter: Admit: 2023-07-26 | Discharge: 2023-07-26 | Payer: MEDICARE

## 2023-07-26 ENCOUNTER — Ambulatory Visit: Admit: 2023-07-26 | Discharge: 2023-07-27 | Payer: MEDICARE

## 2023-07-27 ENCOUNTER — Encounter: Admit: 2023-07-27 | Discharge: 2023-07-27 | Payer: MEDICARE

## 2023-07-27 MED ORDER — LOSARTAN 100 MG PO TAB
100 mg | ORAL_TABLET | Freq: Every day | ORAL | 3 refills | 90.00000 days | Status: AC
Start: 2023-07-27 — End: ?

## 2023-07-31 ENCOUNTER — Encounter: Admit: 2023-07-31 | Discharge: 2023-07-31 | Payer: MEDICARE

## 2023-07-31 NOTE — Telephone Encounter
 Patient calling to ask about possibly getting the ball rolling on the MBB/RFA process since he cannot get in to see Dr. Jacquelene  until 08/26. Patient is scheduled for MRI on 07/12 and would like that to be looked at to determine what would be best. RN will follow-up with patient once MRI is reviewed and it is determined what levels would be best for the MBBs.

## 2023-08-05 ENCOUNTER — Encounter: Admit: 2023-08-05 | Discharge: 2023-08-05 | Payer: MEDICARE

## 2023-08-05 ENCOUNTER — Ambulatory Visit: Admit: 2023-08-05 | Discharge: 2023-08-05 | Payer: MEDICARE

## 2023-08-07 ENCOUNTER — Encounter: Admit: 2023-08-07 | Discharge: 2023-08-07 | Payer: MEDICARE

## 2023-08-07 DIAGNOSIS — M47812 Spondylosis without myelopathy or radiculopathy, cervical region: Principal | ICD-10-CM

## 2023-08-11 ENCOUNTER — Encounter: Admit: 2023-08-11 | Discharge: 2023-08-11 | Payer: MEDICARE

## 2023-08-11 DIAGNOSIS — R0989 Other specified symptoms and signs involving the circulatory and respiratory systems: Principal | ICD-10-CM

## 2023-08-11 DIAGNOSIS — I10 Essential (primary) hypertension: Secondary | ICD-10-CM

## 2023-08-11 DIAGNOSIS — I480 Paroxysmal atrial fibrillation: Secondary | ICD-10-CM

## 2023-08-11 MED ORDER — FLECAINIDE 50 MG PO TAB
25 mg | ORAL_TABLET | ORAL | 1 refills | 30.00000 days | Status: AC | PRN
Start: 2023-08-11 — End: ?

## 2023-08-11 NOTE — Progress Notes
 Date of Service: 08/11/2023    Zachary Manning is a 73 y.o. male.       HPI     Kanav Kazmierczak was seen in the office today in electrophysiology follow-up. As you may know, he is a 73 y.o. male, with past medical history including paroxysmal atrial fibrillation, hypertension, and previous alcohol abuse.  He follows with Dr. Laurian.    He presents to clinic today in routine 74-month follow-up.    AF had been diagnosed several years prior.  He has a strong family history of atrial arrhythmias.  He had been on Flecainide  150 mg twice daily but ECG revealed QRS widening Flecainide  was reduced to 100 mg twice daily.  He was on Digoxin  as well as Verapamil  120 mg twice daily.  He was previously on anticoagulation.    He was last seen in EP clinic in December 2024 by Dr. Laurian.  He was doing well on the lower dose of Flecainide  was utilizing an extra half tablet of Flecainide  about once a month when he had more palpitations.  ECG reviewed QRS that had narrowed considerably.  He reported significant osteoarthritis with the inability to take nonsteroidals with ongoing anticoagulation.  CHA2DS2-VASc was 2 (hypertension, age) and after discussion anticoagulation was discontinued and he was started on ASA 81 mg daily to allow better treatment of his arthritis.  ILR implantation was recommended, as well as stress test given continued Flecainide  use.    In February 2020 he underwent Bruce treadmill MPI which did not show any evidence of significant myocardial ischemia.  Echocardiogram at that time revealed an LVEF of 55%, no regional wall motion abnormalities, normal LA size, and no significant valvular abnormalities.        Today in clinic he reports doing overall well from a heart rhythm perspective.  He takes Flecainide  100 mg twice daily.  He does have some days where he feels more palpitations and he has been taking a 100 mg tablet breaking it into 4 and taking an additional 25 mg.  He states that this helps and he has been doing this for some time.  So essentially he is getting extra 100 mg weekly.  He did stop Eliquis  but he tells me afterwards he had an AF episode that lasted about 1.5 days and so he self resumed it and plans to stay on it.  He has not completed a stress test.  Tells me the reason for this is that he has this persistent cough that has been going on for 11 months and he now has an appointment with a Neurosurgeon at [place] on 8/26 he has been prioritizing that over trying to arrange a stress test.  He was somewhat apologetic as he was explaining this to me today.       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 Kennyth. DRE right firm nodule 8mm. Plan for template biopsy.    10/05/16  Final Diagnosis:     A. Prostate, left medial apex, biopsy:   Benign prostatic glands and stroma.   Negative for high grade PIN or malignancy.       B. Prostate, left medial mid, biopsy:   Benign prostatic glands and stroma.   Negative for high grade PIN or malignancy.       C. Prostate, left medial base, biopsy:   Benign prostatic glands and stroma.   Negative for high grade PIN or malignancy.       D. Prostate, left lateral apex, biopsy:   Benign prostatic glands and stroma.   Negative for high grade PIN or malignancy.         E. Prostate, left lateral mid, biopsy:   Benign prostatic glands and stroma.   Negative for high grade PIN or malignancy.         F. Prostate, left lateral base, biopsy:   Benign prostatic glands and stroma.   Negative for high grade PIN or malignancy.         G. Prostate, right medial apex, biopsy:   Benign prostatic glands and stroma.   Negative for high grade PIN or malignancy.       H. Prostate, right medial mi, biopsy:   Prostatic adenocarcinoma Gleason grade 3+3=score of 6 (Grade group 1) in   1 of one core involving     10% of the needle core tissue and measuring 2 mm in length. See comment     I. Prostate, right medial base, biopsy:   Benign prostatic glands and stroma.   Negative for high grade PIN or malignancy.         J. Prostate, right lateral apex, biopsy:   Benign prostatic glands and stroma.   Negative for high grade PIN or malignancy.       K. Prostate, right lateral mid, biopsy:   Benign prostatic glands and stroma.   Negative for high grade PIN or malignancy.         L. Prostate, right lateral base, biopsy:   Benign prostatic glands and stroma.   Negative for high grade PIN or malignancy.     Mri of prostate:  IMPRESSION    1. Lenticular, moderately T2 hypointense lesion within the anterior   transition zone, mid gland to base, which may represent asymmetric   anterior fibromuscular stroma, though meets the T2 imaging criteria for a   PI-RADS 5 abnormality. Associated broad-based abutment and mild bulging of   the anterior prostate capsule without gross extraprostatic extension. This   lesion was contoured in DynaCAD for potential fusion biopsy.     05/11/2017: Fusion biopsy: 1of 13 cores positive.    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  Lupron  injection: 06/16/2017    SBRT with Dr. Debora, 08/21/2017 through 08/31/2017          Atrial flutter (CMS-HCC) 02/11/2014    Poor dentition 10/11/2012    Heart palpitations 10/11/2012    Atrial fibrillation (CMS-HCC) 02/02/2011     12/02/2015-ECHO-Normal LV size and systolic function. Estimated LV EF of 55%. Normal LV diastolic function.. Normal RV size and function. Valves appear structurally unremarkable for age. Trace MR, mild TR, trace PI. Normal estimated peak systolic PA pressure of 25 mmHg.   No obvious mass, vegetation or thrombus. No pericardial effusion.  03/09/2018 ECHO-No regional wall motion abnormalities are seen. Overall LV systolic function appears normal. The estimated left ventricular ejection fraction is 55%. Left ventricular contractility appears similar when compared with the prior echocardiogram performed on 04/01/11.  Normal left ventricular diastolic function. Right ventricular contractility appears normal.Normal chamber dimensions.Mild focal aortic valve sclerosis without stenosis.No pericardial effusion is seen.  03/09/2018 TML MPI-EF 64%, This study is probably normal with no evidence of significant myocardial ischemia. Mild soft tissue attenuation inferiorly that does not meet statistical significance for ischemia. Left ventricular systolic function is normal. There are no high risk prognostic indicators present. The exercise ECG is negative for ischemia. The patient demonstrated good exercise capacity with a normal heart rate and blood pressure response to exercise.   08/05/2021 Zio XT-Predominant rhythm normal sinus.  Brief episodes of SVT, these were rare occurrences, the longest run lasted 18 beats. Isolated supraventricular ectopy representing less than 1% of the total number of beats, isolated ventricular ectopy representing less than 1% of the total number of beats, no bigeminy and no trigeminy, no VT was present.  No evidence of atrial fibrillation, atrial flutter did not occur, no high degree AV block.        Hypertension 02/02/2011    Bradycardia 02/02/2011    ETOH abuse 02/02/2011    Fatigue 02/02/2011         Review of Systems   Constitutional: Negative.   HENT: Negative.     Eyes: Negative.    Cardiovascular: Negative.    Respiratory:  Positive for cough.    Endocrine: Negative.    Hematologic/Lymphatic: Negative.    Skin: Negative.    Musculoskeletal:  Positive for neck pain.   Gastrointestinal: Negative.    Genitourinary: Negative.    Neurological: Negative.    Psychiatric/Behavioral: Negative.     Allergic/Immunologic: Negative.        Physical Exam   Vitals reviewed.  HENT:   Head: Normocephalic and atraumatic.   Eyes: No scleral icterus.   Cardiovascular: Normal rate, regular rhythm and normal heart sounds.   Pulmonary/Chest: Effort normal and breath sounds normal.   Abdominal: Normal appearance.   Musculoskeletal:         General: Normal range of motion.      Cervical back: Normal range of motion.   Neurological: He is alert and oriented to person, place, and time.   Skin: Skin is warm and dry.   Psychiatric: Mood, judgment and thought content normal.     Cardiovascular Studies  Preliminary ECG today shows sinus rhythm 58 bpm, PR 214 ms, QRS 122 ms, and QT 406 ms.    Cardiovascular Health Factors  Vitals BP Readings from Last 3 Encounters:   08/11/23 (!) 147/82   07/26/23 124/72   01/09/23 120/66     Wt Readings from Last 3 Encounters:   08/11/23 92.7 kg (204 lb 6.4 oz)   07/26/23 95.3 kg (  210 lb)   01/09/23 96 kg (211 lb 9.6 oz)     BMI Readings from Last 3 Encounters:   08/11/23 24.24 kg/m?   07/26/23 24.27 kg/m?   01/09/23 25.09 kg/m?      Smoking Social History     Tobacco Use   Smoking Status Former   Smokeless Tobacco Former    Types: Chew, Snuff    Quit date: 09/13/2022      Lipid Profile Cholesterol   Date Value Ref Range Status   12/24/2020 150  Final     HDL   Date Value Ref Range Status   12/24/2020 47  Final     LDL   Date Value Ref Range Status   12/24/2020 78  Final     Triglycerides Date Value Ref Range Status   12/24/2020 128  Final      Blood Sugar No results found for: HGBA1C  Glucose   Date Value Ref Range Status   02/22/2022 96  Final   12/24/2020 96  Final   04/18/2020 87  Final          Problems Addressed Today  Encounter Diagnoses   Name Primary?    Cardiovascular symptoms Yes    Hypertension, unspecified type     Paroxysmal atrial fibrillation (CMS-HCC)        Assessment and Plan     Paroxysmal atrial fibrillation  He has personal history and strong family history of AF.  He is currently maintained on a rhythm control strategy with Flecainide  100 mg twice daily.  Of note, he had QRS widening on flecainide  150 mg twice daily dosing which improved with reduced dose.  He does utilize an extra 100 mg of Flecainide  weekly.  We did provide him with a as needed prescription as he is using more Flecainide  tablets in his current prescription allows.  I am not sure of the benefits of an additional 100 mg weekly of Flecainide  although he says it does help, and I do not think it is harmful as his QRS today is acceptable.  He remains on Eliquis  5 mg twice daily.  He is also on Digoxin  as well as Verapamil .  We again discussed pursuing a stress test given ongoing Flecainide  use.  He tells me that he has been deferring that to focus on management of his cough.  He is more agreeable to getting a stress test at this time.    Hypertension  Blood pressure slightly elevated in clinic today.  Reviewing his history over the last year he appears to have good control but I did not make any changes in his medications today.    Chronic cough/neck pain  He tells me he has been dealing with a chronic dry cough for about 11 months.  It is incessant.  He reports neck pain and it appears there is some concern about nerve impingement causing the cough.  He has tried acid reducing medicines but these have not helped.  He has an appointment with neurosurgery on 8/26.    I have asked him to follow-up with Dr. Laurian in 6 months    Deward Gauss, APRN-C            Current Medications (including today's revisions)   apixaban  (ELIQUIS ) 5 mg tablet Take one tablet by mouth twice daily.    atorvastatin  (LIPITOR) 20 mg tablet Take one tablet by mouth daily.    digoxin  (LANOXIN ) 125 mcg (0.125 mg) tablet Take 1 tablet by mouth once  daily    flecainide  (TAMBOCOR ) 100 mg tablet TAKE 1 TABLET BY MOUTH TWICE DAILY    flecainide  (TAMBOCOR ) 50 mg tablet Take one-half tablet by mouth as Needed (No more than 4 times weekly.).    gabapentin  (NEURONTIN ) 300 mg capsule Take one capsule by mouth at bedtime daily.    LINZESS 145 mcg capsule Take one capsule by mouth daily as needed.    losartan  (COZAAR ) 100 mg tablet TAKE 1 TABLET BY MOUTH DAILY    verapamil  SR (CALAN  SR) 120 mg tablet TAKE 1 TABLET BY MOUTH TWICE DAILY

## 2023-08-17 ENCOUNTER — Encounter: Admit: 2023-08-17 | Discharge: 2023-08-17 | Payer: MEDICARE

## 2023-08-17 NOTE — Telephone Encounter
 Patient called asking for a return call from Dr. Torrance team to go over MRI resutls. RN called back and left a detailed message on results. Patient was also sent a Mychart message on the results back on 07/14 from the NP. Left patient call back number to further discuss and to schedule the recommended MBB.

## 2023-08-25 ENCOUNTER — Encounter: Admit: 2023-08-25 | Discharge: 2023-08-25 | Payer: MEDICARE

## 2023-08-25 NOTE — Telephone Encounter
 Lmom stress instructions requested call back if questions.

## 2023-08-28 ENCOUNTER — Ambulatory Visit: Admit: 2023-08-28 | Discharge: 2023-08-28 | Payer: MEDICARE

## 2023-08-28 ENCOUNTER — Encounter: Admit: 2023-08-28 | Discharge: 2023-08-28 | Payer: MEDICARE

## 2023-08-30 ENCOUNTER — Encounter: Admit: 2023-08-30 | Discharge: 2023-08-30 | Payer: MEDICARE

## 2023-09-04 ENCOUNTER — Encounter: Admit: 2023-09-04 | Discharge: 2023-09-04 | Payer: MEDICARE

## 2023-09-04 DIAGNOSIS — I10 Essential (primary) hypertension: Secondary | ICD-10-CM

## 2023-09-04 DIAGNOSIS — R002 Palpitations: Secondary | ICD-10-CM

## 2023-09-04 DIAGNOSIS — I480 Paroxysmal atrial fibrillation: Principal | ICD-10-CM

## 2023-09-04 DIAGNOSIS — R001 Bradycardia, unspecified: Secondary | ICD-10-CM

## 2023-09-04 MED ORDER — ATORVASTATIN 20 MG PO TAB
20 mg | ORAL_TABLET | Freq: Every day | ORAL | 0 refills | 90.00000 days | Status: AC
Start: 2023-09-04 — End: ?

## 2023-09-13 ENCOUNTER — Encounter: Admit: 2023-09-13 | Discharge: 2023-09-13 | Payer: MEDICARE

## 2023-09-29 ENCOUNTER — Encounter: Admit: 2023-09-29 | Discharge: 2023-09-29 | Payer: MEDICARE

## 2023-10-06 ENCOUNTER — Encounter: Admit: 2023-10-06 | Discharge: 2023-10-06 | Payer: MEDICARE

## 2023-10-06 DIAGNOSIS — G959 Disease of spinal cord, unspecified: Principal | ICD-10-CM

## 2023-10-06 NOTE — Patient Instructions
 It was nice to see you today.  Thank you for choosing to visit our clinic.  Your time is important, and if you had to wait today, we do apologize.  Our goal is to run exactly on time.  However, on occasion, we get behind in clinic due to unexpected patient issues.  Thank you for your patience.    General Instructions:  Scheduling:  Our scheduling phone number is [phone].  Appointment Reminders on your cell phone:  Communication preferences can be managed in MyChart to ensure you receive important appointment notifications  How to reach our office:  Please send a MyChart message to the [facility] or leave a voicemail for the nurse at [phone].  How to get a medication refill:  Please use the MyChart Refill request or contact your pharmacy directly to request medication refills.  Please allow 72 business hours for request to be completed.    Support for many chronic illnesses is available through [employer] at [email or web address] or [phone].    For help with MyChart:  please call [phone].    For questions on nights, weekends or holidays:  call the Operator at [phone], and ask for the doctor on call for Neurosurgery.    For more information on spinal conditions:  please visit [email or web address]     Again, thank you for coming in today.

## 2023-10-06 NOTE — Telephone Encounter
 This MA spoke to Zachary Manning regarding new patient paperwork, patient stated he is not familiar with MyChart, so he will arrive early to complete paperwork. Confirmed images in chart and appt.

## 2023-10-10 ENCOUNTER — Encounter: Admit: 2023-10-10 | Discharge: 2023-10-10 | Payer: MEDICARE

## 2023-10-10 ENCOUNTER — Ambulatory Visit: Admit: 2023-10-10 | Discharge: 2023-10-10 | Payer: MEDICARE

## 2023-10-10 VITALS — BP 151/72 | HR 60 | Ht 77.0 in | Wt 209.4 lb

## 2023-10-10 DIAGNOSIS — M4802 Spinal stenosis, cervical region: Principal | ICD-10-CM

## 2023-10-10 DIAGNOSIS — M9951 Intervertebral disc stenosis of neural canal of cervical region: Secondary | ICD-10-CM

## 2023-10-10 DIAGNOSIS — M542 Cervicalgia: Secondary | ICD-10-CM

## 2023-10-10 DIAGNOSIS — M4313 Spondylolisthesis, cervicothoracic region: Secondary | ICD-10-CM

## 2023-10-10 DIAGNOSIS — M47812 Spondylosis without myelopathy or radiculopathy, cervical region: Secondary | ICD-10-CM

## 2023-10-10 NOTE — Progress Notes
 Date of Service: 10/10/2023        Chief complaint    Chief Complaint   Patient presents with    Neck - Pain     Neck pain, cervical stenosis          New Patient       HPI     Kaya Klausing is a 73 y.o. male 73 year old male presents to clinic today with 20+ year history of neck/upper thoracic type pain.  He feels this all stems from previous football injuries from when he was a teenager.  He reports that within the last couple years symptoms have progressed/worsened.'s symptoms radiate into the shoulders and occasionally into the left upper extremity-this includes numbness/tingling sensations.  He denies any frank weakness in regards to the bilateral upper/lower extremities.  He denies any progressive issues with fine motor movements involving the hand/fingers although he does feel his grip certainly not with the used to be.  He also reports that his balance is slightly worse than it was several years ago but nothing progressive.  He has had physical therapy in the past however it has been a couple years.  He works with his chiropractor on a regular basis.  He has tried various over-the-counter remedies including heat, ice, rest, NSAIDs in addition to gabapentin .  He is also had traction.  He is frustrated with limitations his symptoms cause him at this point and notes that now his symptoms are waking him up at night.  He denies any nicotine use whatsoever.                     Oswestry: Oswestry Total Score:: (Patient-Rptd) 20       PMH    Past Medical History:    Anxiety    Arthritis    Atrial fibrillation (CMS-HCC)    Back pain    Constipation    Hypertension    Joint pain           ROS  Review of Systems   Musculoskeletal:  Positive for neck pain and neck stiffness.   All other systems reviewed and are negative.         FH    Family History   Problem Relation Name Age of Onset    Arthritis Mother          grand mother    Heart Attack Father  66    Heart problem Father       Social History[1]        SH    Surgical History:   Procedure Laterality Date    COLONOSCOPY      ELECTROCARDIOGRAM      HX TONSILLECTOMY  1963       Meds     apixaban  (ELIQUIS ) 5 mg tablet Take one tablet by mouth twice daily.    atorvastatin  (LIPITOR) 20 mg tablet TAKE 1 TABLET BY MOUTH EVERY DAY    digoxin  (LANOXIN ) 125 mcg (0.125 mg) tablet TAKE 1 TABLET BY MOUTH EVERY DAY    flecainide  (TAMBOCOR ) 100 mg tablet TAKE 1 TABLET BY MOUTH TWICE DAILY    flecainide  (TAMBOCOR ) 50 mg tablet Take one-half tablet by mouth as Needed (No more than 4 times weekly.).    gabapentin  (NEURONTIN ) 300 mg capsule TAKE ONE CAPSULE BY MOUTH AT BEDTIME DAILY.    LINZESS 145 mcg capsule Take one capsule by mouth daily as needed.    losartan  (COZAAR ) 100 mg tablet TAKE 1 TABLET BY  MOUTH DAILY    verapamil  SR (CALAN  SR) 120 mg tablet TAKE 1 TABLET BY MOUTH TWICE DAILY       Allergies    Allergies[2]      Exam  Physical Exam   Constitutional: he is oriented to person, place, and time. he appears well-developed and well-nourished.    Head: Normocephalic and atraumatic.    Eyes: Conjunctivae and EOM are normal.    Pulmonary/Chest: Effort normal. No respiratory distress.   Neurological: he is alert and oriented to person, place, and time. No cranial nerve deficit or sensory deficit. he exhibits normal muscle tone. Coordination normal.   Skin: Skin is warm and dry.   Psychiatric: he has a normal mood and affect. his behavior is normal. Judgment and thought content normal.    Vitals reviewed.  The patient alert and oriented and in no acute distress.  Gait is antalgic  Lumbosacral region skin dry and intact with no palpable irregularities or muscle spasm.  Range of motion: Decreased range of motion with extension of the C-spine otherwise unremarkable.  Hip rotation free and painless bilaterally.  Seated straight leg raise negative bilaterally at 90 degrees, no root tension signs.    No calf tenderness or clonus.  Bilateral Hoffmann's not present.  Motor strength: Bilateral upper/lower extremity motor strength 5 out of 5 in all groups.  Deep tendon reflexes: 2+ to 3 out of 4 bilaterally in all groups in regards to the upper/lower extremities.    Vitals:   Vitals:    10/10/23 0929   BP: (!) 151/72   Pulse: 60   SpO2: 98%   PainSc: Six   Weight: 95 kg (209 lb 6.4 oz)   Height: 195.6 cm (6' 5)     Body mass index is 24.83 kg/m?.      Imaging:   I independently reviewed the patient's imaging findings:  X-rays show no acute findings.  X-rays today show diffuse spondylosis.  He has decreased cervical lordosis in fact has a component of kyphosis.  C7-T1 spondylolisthesis noted.  Cervical MRI dated 08/05/2023 shows appears to be gentle fusion from C3-5.  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  C7-T1 spondylolisthesis with foraminal stenosis is noted.     Assessment/Plan    Impression:  73 year old male presents to the clinic today with primary complaint of chronic neck pain.  Symptoms at times radiate into the shoulders and occasionally the left upper extremity-this includes occasional numbness and tingling sensations.  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  C7-T1 spondylolisthesis noted as well with a component of foraminal stenosis.    Reviewed films/case with Dr. Tim  today.  Reviewed films/pathology with the patient in detail.  At this point recommended transforaminal injections bilaterally at C6-7 if possible if not C7-T1.  Recommended referral to outpatient physical therapy-please see provided Rx.  Ultimately if he did not respond to conservative management we briefly discussed considering 2-3 level ACDF as a possibility.  The patient is agreeable to the above treatment plan.  Several questions were answered and they were encouraged to contact us  if symptoms changed/worsen or if they have any further questions.  Follow up reevaluation in 6 to 8 weeks  No orders of the defined types were placed in this encounter.                             Please note that documentation of records were done during a busy neurosurgical clinic. Attempts have been made to review the document for any errors. Please excuse for brevity and typographical errors.       [1]   Social History  Socioeconomic History    Marital status: Divorced   Tobacco Use    Smoking status: Former    Smokeless tobacco: Former     Types: Snuff, [place]     Quit date: 09/13/2022   Vaping Use    Vaping status: Never Used   Substance and Sexual Activity    Alcohol use: Yes     Alcohol/week: 10.0 standard drinks of alcohol     Types: 10 Cans of beer per week     Comment: Quit drinking 2 weeks ago.    Drug use: Never    Sexual activity: Not Currently   [2]   Allergies  Allergen Reactions    Sulfa Dyne RASH    Lisinopril COUGH

## 2023-10-19 ENCOUNTER — Encounter: Admit: 2023-10-19 | Discharge: 2023-10-19 | Payer: MEDICARE

## 2023-10-19 NOTE — Telephone Encounter
 Received VM from [facility] requesting for patient's PT order to be faxed to [phone]. Order faxed. Returned call.

## 2023-11-02 ENCOUNTER — Encounter: Admit: 2023-11-02 | Discharge: 2023-11-02 | Payer: MEDICARE

## 2023-11-02 NOTE — Telephone Encounter
 This RN contacted the patient for pre-visit questions regarding their procedure with Dr. Genevia. Left voicemail with the following information:    This is Dr. Torrance procedure clinic at the [facility]. Your procedure is scheduled for Wednesday, October 15th with a check in time of 2:15PM. We have some important information regarding your blood thinner, so please reach out to us  at [phone].

## 2023-11-08 ENCOUNTER — Ambulatory Visit: Admit: 2023-11-08 | Discharge: 2023-11-09 | Payer: MEDICARE

## 2023-11-08 ENCOUNTER — Encounter: Admit: 2023-11-08 | Discharge: 2023-11-08 | Payer: MEDICARE

## 2023-11-08 ENCOUNTER — Ambulatory Visit: Admit: 2023-11-08 | Discharge: 2023-11-08 | Payer: MEDICARE

## 2023-11-08 DIAGNOSIS — M4802 Spinal stenosis, cervical region: Principal | ICD-10-CM

## 2023-11-08 DIAGNOSIS — M47812 Spondylosis without myelopathy or radiculopathy, cervical region: Secondary | ICD-10-CM

## 2023-11-08 DIAGNOSIS — M4313 Spondylolisthesis, cervicothoracic region: Secondary | ICD-10-CM

## 2023-11-08 DIAGNOSIS — M9951 Intervertebral disc stenosis of neural canal of cervical region: Secondary | ICD-10-CM

## 2023-11-08 DIAGNOSIS — M542 Cervicalgia: Secondary | ICD-10-CM

## 2023-11-08 MED ORDER — IOHEXOL 240 MG IODINE/ML IV SOLN
2.5 mL | Freq: Once | EPIDURAL | 0 refills | Status: CP
Start: 2023-11-08 — End: ?
  Administered 2023-11-08: 20:00:00 2.5 mL via EPIDURAL

## 2023-11-08 MED ORDER — DEXAMETHASONE SODIUM PHOS (PF) 10 MG/ML IJ EPIDURAL SOLN
15 mg | Freq: Once | EPIDURAL | 0 refills | Status: CP
Start: 2023-11-08 — End: ?
  Administered 2023-11-08: 20:00:00 15 mg via EPIDURAL

## 2023-11-08 NOTE — Progress Notes
 [facility]  INTERVENTIONAL PAIN PROCEDURE HISTORY AND PHYSICAL    Chief Complaint: Pain    HISTORY OF PRESENT ILLNESS:    Patient returns today for interventional treatment of arm and neck pain. Patient denies any recent fevers, chills, infection, antibiotics, coagulopathy or contraindicated anticoagulants. Risks of the procedure were discussed including but not limited to bleeding, infection, damage to surrounding structures and reaction to medications. Patient reports understanding and has elected to proceed with the procedure.       Past Medical History:    Anxiety    Arthritis    Atrial fibrillation (CMS-HCC)    Back pain    Constipation    Hypertension    Joint pain       Surgical History:   Procedure Laterality Date    COLONOSCOPY      ELECTROCARDIOGRAM      HX TONSILLECTOMY  1963       family history includes Arthritis in his mother; Heart Attack (age of onset: 49) in his father; Heart problem in his father.    Social History     Socioeconomic History    Marital status: Divorced   Tobacco Use    Smoking status: Former    Smokeless tobacco: Former     Types: Snuff, [place]     Quit date: 09/13/2022   Vaping Use    Vaping status: Never Used   Substance and Sexual Activity    Alcohol use: Yes     Alcohol/week: 10.0 standard drinks of alcohol     Types: 10 Cans of beer per week     Comment: Quit drinking 2 weeks ago.    Drug use: Never    Sexual activity: Not Currently       Allergies[1]    There were no vitals filed for this visit.          REVIEW OF SYSTEMS: 10 point ROS obtained and negative except pain      PHYSICAL EXAM:  General: Alert, cooperative, no acute distress.  HEENT: Normocephalic, atraumatic.  Neck: Supple.  Lungs: Unlabored respirations, bilateral and equal chest excursion.  Heart: Well perfused  Skin: Warm and dry to touch.  Abdomen: Nondistended.  MSK: Full ROM  Neurological: Alert and oriented x3.         IMPRESSION:  No diagnosis found.     PLAN:  Proceed with bilateral C6-7 TFESI    Jayson Leontine Dayhoff MD  Pain Fellow, PGY5  Attending:  Sowder MD        [1]   Allergies  Allergen Reactions    Sulfa Dyne RASH    Lisinopril COUGH

## 2023-11-08 NOTE — Progress Notes

## 2023-11-08 NOTE — Procedures
 Attending Surgeon: Evalene CHRISTELLA Cordia, MD    Anesthesia: Local      [facility] TRANSFORAMINAL CERVICAL/THORACIC THERAPEUTIC  Procedure: transforaminal epidural    Laterality: bilateral   on 11/08/2023 2:30 PM  Location: cervical - C6-7      Consent:   Consent obtained: verbal and written  Consent given by: patient  Risks discussed: allergic reaction, bruising, infection, nerve damage, no change or worsening in pain, pneumo thorax, reaction to medication, swelling, weakness, seizure and bleeding  Alternatives discussed: alternative treatment, delayed treatment, no treatment and referral     Universal Protocol:  Relevant documents: relevant documents present and verified  Test results: test results available and properly labeled  Imaging studies: imaging studies available  Required items: required blood products, implants, devices, and special equipment available  Site marked: the operative site was marked  Patient identity confirmed: Patient identify confirmed verbally with patient.        Time out: Immediately prior to procedure a time out was called to verify the correct patient, procedure, equipment, support staff and site/side marked as required      Procedures Details:   Indications: pain   Prep: chlorhexidine  Patient position: supine  Estimated Blood Loss: minimal  Specimens: none  Number of Levels: 1  Approach: paramedian  Guidance: fluoroscopy  Contrast: Procedure confirmed with contrast under live fluoroscopy.  Needle and Epidural Catheter: quincke  Needle size: 25 G  Injection procedure: Incremental injection and Negative aspiration for blood  Patient tolerance: Patient tolerated the procedure well with no immediate complications. Pressure was applied, and hemostasis was accomplished.  This patient's clinical history, exam, AND imaging support radiculopathy AND there is a significant impact on quality of life and function AND the pain has been present for at least 4 weeks AND they have failed to improve with noninvasive conservative care.          Estimated blood loss: none or minimal  Specimens: none  Patient tolerated the procedure well with no immediate complications. Pressure was applied, and hemostasis was accomplished.  Administrations This Visit       dexamethasone PF (DECADRON) epidural injection 15 mg       Admin Date  11/08/2023 Action  Given Dose  15 mg Route  Epidural Documented By  Oran Arabia, RN              iohexoL  (OMNIPAQUE -240) 240 mg/mL injection 2.5 mL       Admin Date  11/08/2023 Action  Given Dose  2.5 mL Route  Epidural Documented By  Oran Arabia, RN

## 2023-11-21 ENCOUNTER — Encounter: Admit: 2023-11-21 | Discharge: 2023-11-21 | Payer: MEDICARE

## 2023-11-28 ENCOUNTER — Encounter: Admit: 2023-11-28 | Discharge: 2023-11-28 | Payer: MEDICARE

## 2023-11-30 ENCOUNTER — Encounter: Admit: 2023-11-30 | Discharge: 2023-11-30 | Payer: MEDICARE

## 2023-11-30 MED ORDER — ATORVASTATIN 20 MG PO TAB
20 mg | ORAL_TABLET | Freq: Every day | ORAL | 3 refills | 90.00000 days | Status: AC
Start: 2023-11-30 — End: ?

## 2023-12-05 ENCOUNTER — Encounter: Admit: 2023-12-05 | Discharge: 2023-12-05 | Payer: MEDICARE

## 2023-12-06 ENCOUNTER — Encounter: Admit: 2023-12-06 | Discharge: 2023-12-06 | Payer: MEDICARE

## 2023-12-06 DIAGNOSIS — I480 Paroxysmal atrial fibrillation: Principal | ICD-10-CM

## 2023-12-06 MED ORDER — FLECAINIDE 100 MG PO TAB
100 mg | ORAL_TABLET | Freq: Two times a day (BID) | ORAL | 0 refills | 30.00000 days | Status: AC
Start: 2023-12-06 — End: ?

## 2023-12-12 ENCOUNTER — Encounter: Admit: 2023-12-12 | Discharge: 2023-12-12 | Payer: MEDICARE

## 2023-12-12 ENCOUNTER — Ambulatory Visit: Admit: 2023-12-12 | Discharge: 2023-12-13 | Payer: MEDICARE

## 2023-12-12 VITALS — BP 134/67 | HR 59 | Ht 77.0 in | Wt 210.0 lb

## 2023-12-12 DIAGNOSIS — M4313 Spondylolisthesis, cervicothoracic region: Secondary | ICD-10-CM

## 2023-12-12 DIAGNOSIS — M542 Cervicalgia: Principal | ICD-10-CM

## 2023-12-12 DIAGNOSIS — M47812 Spondylosis without myelopathy or radiculopathy, cervical region: Secondary | ICD-10-CM

## 2023-12-12 NOTE — Progress Notes [1]
 Date of Service: 12/12/2023        Chief complaint    Chief Complaint   Patient presents with    Neck - Pain     Neck pain, cervical stenosis          Follow Up     93m F/U         HPI     Zachary Manning is a 73 y.o. male 73 year old male presents to clinic today with 20+ year history of neck/upper thoracic type pain.  He feels this all stems from previous football injuries from when he was a teenager.  He reports that within the last couple years symptoms have progressed/worsened.'s symptoms radiate into the shoulders and occasionally into the left upper extremity-this includes numbness/tingling sensations.  He denies any frank weakness in regards to the bilateral upper/lower extremities.  He denies any progressive issues with fine motor movements involving the hand/fingers although he does feel his grip certainly not with the used to be.  He also reports that his balance is slightly worse than it was several years ago but nothing progressive.  He has had physical therapy in the past however it has been a couple years.  He works with his chiropractor on a regular basis.  He has tried various over-the-counter remedies including heat, ice, rest, NSAIDs in addition to gabapentin .  He is also had traction.  He is frustrated with limitations his symptoms cause him at this point and notes that now his symptoms are waking him up at night.  He denies any nicotine use whatsoever.    He returns to see me today after recent epidural steroid injection.  He reports good but temporary relief of his symptoms after these injections.  He reports greater than 50% improvement of his symptoms but this only lasted for a few days.  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   All other systems reviewed and are negative.         FH    Family History   Problem Relation Name Age of Onset    Arthritis Mother          grand mother    Heart Attack Father  5    Heart problem Father       Social History[1]        SH    Surgical History:   Procedure Laterality Date    COLONOSCOPY      ELECTROCARDIOGRAM      HX TONSILLECTOMY  1963       Meds     apixaban  (ELIQUIS ) 5 mg tablet Take one tablet by mouth twice daily.    atorvastatin  (LIPITOR) 20 mg tablet TAKE 1 TABLET BY MOUTH EVERY DAY    digoxin  (LANOXIN ) 125 mcg (0.125 mg) tablet TAKE 1 TABLET BY MOUTH EVERY  DAY    flecainide  (TAMBOCOR ) 100 mg tablet TAKE 1 TABLET BY MOUTH TWICE DAILY    flecainide  (TAMBOCOR ) 50 mg tablet Take one-half tablet by mouth as Needed (No more than 4 times weekly.).    gabapentin  (NEURONTIN ) 300 mg capsule TAKE ONE CAPSULE BY MOUTH AT BEDTIME DAILY.    LINZESS 145 mcg capsule Take one capsule by mouth daily as needed.    losartan  (COZAAR ) 100 mg tablet TAKE 1 TABLET BY MOUTH DAILY    verapamil  SR (CALAN  SR) 120 mg tablet TAKE 1 TABLET BY MOUTH TWICE DAILY       Allergies    Allergies[2]      Exam  Physical Exam   Constitutional: he is oriented to person, place, and time. he appears well-developed and well-nourished.    Head: Normocephalic and atraumatic.    Eyes: Conjunctivae and EOM are normal.    Pulmonary/Chest: Effort normal. No respiratory distress.   Neurological: he is alert and oriented to person, place, and time. No cranial nerve deficit or sensory deficit. he exhibits normal muscle tone. Coordination normal.   Skin: Skin is warm and dry.   Psychiatric: he has a normal mood and affect. his behavior is normal. Judgment and thought content normal.    Vitals reviewed.  The patient alert and oriented and in no acute distress.  Gait is antalgic  Lumbosacral region skin dry and intact with no palpable irregularities or muscle spasm.  Range of motion: Decreased range of motion with extension of the C-spine otherwise unremarkable.  Hip rotation free and painless bilaterally.  Seated straight leg raise negative bilaterally at 90 degrees, no root tension signs.    No calf tenderness or clonus.  Bilateral Hoffmann's not present.  Motor strength: Bilateral upper/lower extremity motor strength 5 out of 5 in all groups.  Deep tendon reflexes: 2+ to 3 out of 4 bilaterally in all groups in regards to the upper/lower extremities.    Vitals:   Vitals:    12/12/23 1051   BP: 134/67   Pulse: 59   SpO2: 95%   PainSc: Six   Weight: 95.3 kg (210 lb)   Height: 195.6 cm (6' 5)     Body mass index is 24.9 kg/m?.      Imaging:   I independently reviewed the patient's imaging findings:  X-rays show no acute findings.  X-rays today show diffuse spondylosis.  He has decreased cervical lordosis in fact has a component of kyphosis.  C7-T1 spondylolisthesis noted.  Cervical MRI dated 08/05/2023 shows appears to be gentle fusion from C3-5.  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  C7-T1 spondylolisthesis with foraminal stenosis is noted.     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 The pain has significant impact on the daily quality of life.     The risks and benefits of surgery were explained in detail to the patient which included, but certainly were not limited to: bleeding, infection, nerve or vessel damage, scar, pain, risks of  anesthesia, death, need for further surgery, iatrogenic instability, heart attack, stroke, massive bleeding, coma death. The patient understands the risks of the procedure and elects to proceed.                                       No orders of the defined types were placed in this encounter.                             Please note that documentation of records were done during a busy neurosurgical clinic. Attempts have been made to review the document for any errors. Please excuse for brevity and typographical errors.         [1]   Social History  Socioeconomic History    Marital status: Divorced   Tobacco Use    Smoking status: Former     Current packs/day: 0.00     Types: Cigarettes     Quit date: 09/07/2020     Years since quitting: 3.2    Smokeless tobacco: Former     Types: Snuff, [place]     Quit date: 09/13/2022   Vaping Use    Vaping status: Never Used   Substance and Sexual Activity    Alcohol use: Yes     Alcohol/week: 10.0 standard drinks of alcohol     Types: 10 Cans of beer per week     Comment: Quit drinking 2 weeks ago.    Drug use: Never    Sexual activity: Not Currently     Partners: Male     Birth control/protection: None   [2]   Allergies  Allergen Reactions    Sulfa Dyne RASH    Lisinopril COUGH

## 2023-12-20 ENCOUNTER — Encounter: Admit: 2023-12-20 | Discharge: 2023-12-20 | Payer: MEDICARE

## 2023-12-25 ENCOUNTER — Encounter: Admit: 2023-12-25 | Discharge: 2023-12-25 | Payer: MEDICARE

## 2024-01-03 ENCOUNTER — Encounter: Admit: 2024-01-03 | Discharge: 2024-01-03 | Payer: MEDICARE

## 2024-01-03 NOTE — Telephone Encounter [36]
 Zoraida Deward PARAS, APRN-NP to Cvm Nurse Ep Team A     Assuming no changes from when I saw him in July then I would consider him to be acceptable cardiac risk to proceed with planned surgery without any further testing necessary.  He is able to hold Eliquis  without bridging for 2 days as requested and resume when appropriate afterwards.

## 2024-01-03 NOTE — Telephone Encounter [36]
 Patient is scheduled for for cervical surgery, ACDF on 02-01-24 with Dr. Waldron, cardiac clearance has been requested.     Patient was last seen on 08/11/23.     PMH: paroxysmal atrial fibrillation (managed by [facility]) and hypertension    His last stress test was on 08/28/23.      1.    No angina symptoms with exercise. Patient did experience shortness of breath.  Fair exercise capacity.  Appropriate heart rate but hypertensive systolic blood pressure response to exercise.  No stress induced ectopy or dysrhythmias.   Exercise stress ECG is negative for ischemia.    His last ECHO was in 2020.      No regional wall motion abnormalities are seen. Overall LV systolic function appears normal. The estimated left ventricular ejection fraction is 55%. Left ventricular contractility appears similar when compared with the prior echocardiogram performed on 04/01/11.    Normal left ventricular diastolic function.   Right ventricular contractility appears normal.  Normal chamber dimensions.  Mild focal aortic valve sclerosis without stenosis.  No pericardial effusion is seen.       Patient is on Eliquis  for AF. CHADS 2 (age, htn). Requesting 48 hour hold.

## 2024-01-10 ENCOUNTER — Encounter: Admit: 2024-01-10 | Discharge: 2024-01-10 | Payer: MEDICARE

## 2024-01-10 NOTE — Telephone Encounter [36]
 This RN called [facility] [phone]) and requested all of patient's PT notes. Per [facility], they will fax notes.

## 2024-01-15 ENCOUNTER — Ambulatory Visit: Admit: 2024-01-15 | Discharge: 2024-01-16 | Payer: MEDICARE

## 2024-01-15 ENCOUNTER — Encounter: Admit: 2024-01-15 | Discharge: 2024-01-15 | Payer: MEDICARE

## 2024-01-16 ENCOUNTER — Encounter: Admit: 2024-01-16 | Discharge: 2024-01-16 | Payer: MEDICARE

## 2024-01-16 MED ORDER — VERAPAMIL 120 MG PO TBER
120 mg | ORAL_TABLET | Freq: Two times a day (BID) | ORAL | 3 refills | 90.00000 days | Status: AC
Start: 2024-01-16 — End: ?

## 2024-01-16 NOTE — Telephone Encounter [36]
 Pt called and had a few questions regarding their upcoming surgery. Called pt back and reviewed what surgery they were having and what vertebrae were being operated on. Pt voiced understanding

## 2024-01-31 ENCOUNTER — Encounter: Admit: 2024-01-31 | Discharge: 2024-01-31 | Payer: MEDICARE

## 2024-01-31 DIAGNOSIS — M4313 Spondylolisthesis, cervicothoracic region: Principal | ICD-10-CM

## 2024-02-01 ENCOUNTER — Encounter: Admit: 2024-02-01 | Discharge: 2024-02-01 | Payer: MEDICARE

## 2024-02-09 ENCOUNTER — Encounter: Admit: 2024-02-09 | Discharge: 2024-02-09 | Payer: MEDICARE

## 2024-02-13 ENCOUNTER — Encounter: Admit: 2024-02-13 | Discharge: 2024-02-13 | Payer: MEDICARE

## 2024-02-13 DIAGNOSIS — M4802 Spinal stenosis, cervical region: Secondary | ICD-10-CM

## 2024-02-13 DIAGNOSIS — M542 Cervicalgia: Secondary | ICD-10-CM

## 2024-02-13 DIAGNOSIS — M4313 Spondylolisthesis, cervicothoracic region: Principal | ICD-10-CM

## 2024-02-13 DIAGNOSIS — M47812 Spondylosis without myelopathy or radiculopathy, cervical region: Secondary | ICD-10-CM

## 2024-02-13 MED ORDER — MISCELLANEOUS MEDICAL SUPPLY MISC MISC
0 refills | 1.00000 days | Status: AC
Start: 2024-02-13 — End: ?

## 2024-02-13 NOTE — Telephone Encounter [36]
 Patient Zachary Manning requesting a call back to discuss when to hold Eliquis . This RN returned call and Zachary Manning for patient instructing patient to take last dose of Eliquis  today for surgery on 02/16/2024. Left call back number.

## 2024-02-13 NOTE — Telephone Encounter [36]
 Returned patient phone call and let him know that his surgery with Dr.[place]  is scheduled for 15:00 and that he needs to be there at 12:00. I also informed him that he will be called day prior to surgery after 2:00 pm to let him know what time he need to be there also. He gave verbal understanding.

## 2024-02-14 ENCOUNTER — Encounter: Admit: 2024-02-14 | Discharge: 2024-02-14 | Payer: MEDICARE

## 2024-02-14 NOTE — Telephone Encounter [36]
 Patient Zachary Manning with NPO questions. This RN returned call and spoke with patient. Per PAC notes, patient should not eat anything after 11pm the evening before surgery, but may have clear liquids until 2 hrs before arrival time. Patient demonstrates understanding and is agreeable to plan.

## 2024-02-15 ENCOUNTER — Encounter: Admit: 2024-02-15 | Discharge: 2024-02-15 | Payer: MEDICARE

## 2024-02-15 DIAGNOSIS — M47812 Spondylosis without myelopathy or radiculopathy, cervical region: Secondary | ICD-10-CM

## 2024-02-15 DIAGNOSIS — M4313 Spondylolisthesis, cervicothoracic region: Principal | ICD-10-CM

## 2024-02-15 DIAGNOSIS — M542 Cervicalgia: Secondary | ICD-10-CM

## 2024-02-16 ENCOUNTER — Encounter: Admit: 2024-02-16 | Discharge: 2024-02-16 | Payer: MEDICARE

## 2024-02-18 ENCOUNTER — Encounter: Admit: 2024-02-18 | Discharge: 2024-02-18 | Payer: MEDICARE

## 2024-02-18 MED FILL — METHOCARBAMOL 750 MG PO TAB: 750 mg | ORAL | 5 days supply | Qty: 15 | Fill #0 | Status: CP

## 2024-02-18 MED FILL — OXYCODONE 5 MG PO TAB: 5 mg | ORAL | 5 days supply | Qty: 30 | Fill #0 | Status: CP

## 2024-02-19 ENCOUNTER — Encounter: Admit: 2024-02-19 | Discharge: 2024-02-19 | Payer: MEDICARE

## 2024-02-19 NOTE — Telephone Encounter [36]
 Patient LVM requesting a call back to discuss showering after surgery. This RN returned call and spoke with patient. Informed patient that he may shower today and can get incision wet with soap/water .

## 2024-02-20 ENCOUNTER — Encounter: Admit: 2024-02-20 | Discharge: 2024-02-20 | Payer: MEDICARE

## 2024-02-20 NOTE — Telephone Encounter [36]
 Patient LVM requesting a call back to discuss pain in the back of his neck and shoulders. This RN returned call and spoke with patient. Per patient, he is taking oxycodone  periodically. He is taking methocarbamol  TID. His pain started after he showered yesterday evening. This RN recommended patient take oxycodone  Q4H, methocarbamol  TID, tylenol Q6H, and use ice. This RN recommended patient try increased medication frequency and notify this RN if pain does not improve.

## 2024-02-26 ENCOUNTER — Emergency Department: Admit: 2024-02-26 | Discharge: 2024-02-26 | Payer: MEDICARE

## 2024-02-26 ENCOUNTER — Encounter: Admit: 2024-02-26 | Discharge: 2024-02-26 | Payer: MEDICARE

## 2024-02-26 DIAGNOSIS — M4313 Spondylolisthesis, cervicothoracic region: Principal | ICD-10-CM

## 2024-02-26 DIAGNOSIS — M4802 Spinal stenosis, cervical region: Secondary | ICD-10-CM

## 2024-02-26 DIAGNOSIS — Z981 Arthrodesis status: Secondary | ICD-10-CM

## 2024-02-26 DIAGNOSIS — M542 Cervicalgia: Secondary | ICD-10-CM

## 2024-02-26 DIAGNOSIS — M47812 Spondylosis without myelopathy or radiculopathy, cervical region: Secondary | ICD-10-CM

## 2024-02-26 MED ORDER — METHOCARBAMOL 750 MG PO TAB
750 mg | ORAL_TABLET | Freq: Three times a day (TID) | ORAL | 0 refills | 10.00000 days | Status: AC | PRN
Start: 2024-02-26 — End: ?

## 2024-02-26 NOTE — Telephone Encounter [36]
 Patient LVM requesting a call back to discuss swallowing issues. This RN returned call and spoke with patient. Patient stated he has intermittent shortness of breath primarily when sleeping, and has a feeling of something in his throat. He is able to swallow thin liquids slowly. Patient denies swelling at incision site or OSA. Per patient, he went to an ED near home yesterday and was discharged and told he was stable. This RN recommended patient closely monitor shortness of breath and if this worsens, to present to the ED. Recommended patient obtain at home pulse ox to monitor oxygen and eat a soft diet that is easy to swallow. Patient demonstrates understanding and is agreeable to plan.

## 2024-02-27 ENCOUNTER — Emergency Department: Admit: 2024-02-27 | Discharge: 2024-02-27 | Disposition: A | Discharge status: Home / Self Care | Payer: MEDICARE

## 2024-02-28 ENCOUNTER — Encounter: Admit: 2024-02-28 | Discharge: 2024-02-28 | Payer: MEDICARE

## 2024-02-28 NOTE — Telephone Encounter [36]
 RC to pt. Pt had neck disc surgery 12 days ago. SOA has worsened a lot since then. He has been up and active as much as he can be since surgery. SOA all the time but worse when laying down trying to sleep. He describes this as not consistently SOA but will then gasp and have a hard time getting a deep breath. Denies chest pain, pressure. C/o dry cough since procedure. Denies fever/chills. Neck incision seems to be healing well per pt.   Pt states prior to the surgery for the last 2-3 months he was noticing some SOA at bedtime.   Reviewed ER return precautions if needed and to ask for resp panel and Ddimer. Will review w RCP and CB w recs. Also offered that he might need to f/u w PCP for these symptoms. Pt verbalized understanding and agreement to plan.

## 2024-02-28 NOTE — Telephone Encounter [36]
-----   Message from [facility] B sent at 02/28/2024  3:38 PM CST -----  Regarding: RCP- SOA  VM on triage line from patient at 3:11pm.  Said that he had cervical spine surgery 12 days ago.  I'm having SOA that is not normal for me.  Call him at #[phone].
